# Patient Record
Sex: Female | Born: 1990 | Race: White | Hispanic: No | Marital: Single | Smoking: Former smoker
Health system: Southern US, Community
[De-identification: ages and names within clinical notes are randomized; demographics above are authoritative.]

## PROBLEM LIST (undated history)

## (undated) ENCOUNTER — Inpatient Hospital Stay (HOSPITAL_COMMUNITY): Payer: BC Managed Care – PPO

## (undated) ENCOUNTER — Inpatient Hospital Stay (HOSPITAL_COMMUNITY): Payer: Self-pay

## (undated) DIAGNOSIS — D6851 Activated protein C resistance: Secondary | ICD-10-CM

## (undated) DIAGNOSIS — N390 Urinary tract infection, site not specified: Secondary | ICD-10-CM

## (undated) DIAGNOSIS — N200 Calculus of kidney: Secondary | ICD-10-CM

## (undated) DIAGNOSIS — K219 Gastro-esophageal reflux disease without esophagitis: Secondary | ICD-10-CM

## (undated) DIAGNOSIS — R51 Headache: Secondary | ICD-10-CM

## (undated) DIAGNOSIS — Z8619 Personal history of other infectious and parasitic diseases: Secondary | ICD-10-CM

## (undated) HISTORY — DX: Gastro-esophageal reflux disease without esophagitis: K21.9

## (undated) HISTORY — PX: DILATION AND CURETTAGE OF UTERUS: SHX78

## (undated) HISTORY — DX: Personal history of other infectious and parasitic diseases: Z86.19

---
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
## 2008-04-20 ENCOUNTER — Inpatient Hospital Stay (HOSPITAL_COMMUNITY): Admission: AD | Admit: 2008-04-20 | Discharge: 2008-04-20 | Payer: Self-pay | Admitting: Obstetrics & Gynecology

## 2009-04-27 ENCOUNTER — Emergency Department (HOSPITAL_COMMUNITY): Admission: EM | Admit: 2009-04-27 | Discharge: 2009-04-27 | Payer: Self-pay | Admitting: Emergency Medicine

## 2010-06-23 ENCOUNTER — Inpatient Hospital Stay (INDEPENDENT_AMBULATORY_CARE_PROVIDER_SITE_OTHER)
Admission: RE | Admit: 2010-06-23 | Discharge: 2010-06-23 | Disposition: A | Discharge status: Home / Self Care | Payer: Self-pay | Source: Ambulatory Visit | Attending: Family Medicine | Admitting: Family Medicine

## 2010-06-23 DIAGNOSIS — R112 Nausea with vomiting, unspecified: Secondary | ICD-10-CM

## 2010-06-23 LAB — POCT URINALYSIS DIPSTICK
Bilirubin Urine: NEGATIVE
Ketones, ur: NEGATIVE mg/dL
Nitrite: NEGATIVE
Protein, ur: NEGATIVE mg/dL
Specific Gravity, Urine: 1.015 (ref 1.005–1.030)
Urine Glucose, Fasting: NEGATIVE mg/dL
Urobilinogen, UA: 0.2 mg/dL (ref 0.0–1.0)
pH: 8.5 — ABNORMAL HIGH (ref 5.0–8.0)

## 2010-06-23 LAB — POCT PREGNANCY, URINE: Preg Test, Ur: NEGATIVE

## 2010-06-23 LAB — TSH: TSH: 2.25 u[IU]/mL (ref 0.350–4.500)

## 2010-08-19 LAB — POCT PREGNANCY, URINE: Preg Test, Ur: NEGATIVE

## 2010-08-19 LAB — POCT URINALYSIS DIP (DEVICE)
Bilirubin Urine: NEGATIVE
Glucose, UA: NEGATIVE mg/dL
Hgb urine dipstick: NEGATIVE
Ketones, ur: NEGATIVE mg/dL
Nitrite: NEGATIVE
Protein, ur: NEGATIVE mg/dL
Specific Gravity, Urine: 1.025 (ref 1.005–1.030)
Urobilinogen, UA: 0.2 mg/dL (ref 0.0–1.0)
pH: 6 (ref 5.0–8.0)

## 2011-02-20 LAB — URINALYSIS, ROUTINE W REFLEX MICROSCOPIC
Glucose, UA: NEGATIVE mg/dL
Ketones, ur: NEGATIVE mg/dL
Nitrite: NEGATIVE
Specific Gravity, Urine: 1.02 (ref 1.005–1.030)
pH: 6 (ref 5.0–8.0)

## 2011-02-20 LAB — POCT PREGNANCY, URINE: Preg Test, Ur: POSITIVE

## 2011-10-18 ENCOUNTER — Emergency Department (HOSPITAL_COMMUNITY): Payer: Worker's Compensation

## 2011-10-18 ENCOUNTER — Encounter (HOSPITAL_COMMUNITY): Payer: Self-pay | Admitting: Emergency Medicine

## 2011-10-18 ENCOUNTER — Emergency Department (HOSPITAL_COMMUNITY)
Admission: EM | Admit: 2011-10-18 | Discharge: 2011-10-19 | Disposition: A | Discharge status: Home / Self Care | Payer: Worker's Compensation | Attending: Emergency Medicine | Admitting: Emergency Medicine

## 2011-10-18 DIAGNOSIS — M545 Low back pain, unspecified: Secondary | ICD-10-CM | POA: Insufficient documentation

## 2011-10-18 DIAGNOSIS — M546 Pain in thoracic spine: Secondary | ICD-10-CM | POA: Insufficient documentation

## 2011-10-18 DIAGNOSIS — F17200 Nicotine dependence, unspecified, uncomplicated: Secondary | ICD-10-CM | POA: Insufficient documentation

## 2011-10-18 DIAGNOSIS — M549 Dorsalgia, unspecified: Secondary | ICD-10-CM

## 2011-10-18 NOTE — ED Notes (Signed)
Pt alert, nad, c/o low back and right lower back pain, onset last week after work related injury, pt ambulates to triage,. Steady gait noted

## 2011-10-18 NOTE — ED Provider Notes (Signed)
History     CSN: [id number]  Arrival date & time 10/18/11  2153   First MD Initiated Contact with Patient 10/18/11 2306      Chief Complaint  Patient presents with  . Back Pain    (Consider location/radiation/quality/duration/timing/severity/associated sxs/prior treatment) HPI Comments: Patient is a current everyday smoker who presents emergency department with a chief complaint of back pain.  Onset of symptoms began last Sunday after lifting 20 pound charcoal bag over her head while at work [employer]).  Patient's pain has been gradually worsening that event.  Location is the lumbar region and thoracic region right side greater than left side and described as a dull constant aching that does not radiate.  Patient denies any numbness or tingling of extremities, bowel or bladder incontinence, saddle paresthesias, fevers, night sweats, chills, IV drug use, cough.  Pain is worsened by standing for long periods and bending.  Patient has been taken 800 IV Zofran 4 times a day without relief.  Patient states she wakes up in the morning and pain is mild and gradually worsened throughout the day.  Patient has no other complaints at this time.  Patient is a 21 y.o. female presenting with back pain. The history is provided by the patient.  Back Pain  Pertinent negatives include no chest pain, no fever, no numbness, no headaches, no abdominal pain, no dysuria and no weakness.    History reviewed. No pertinent past medical history.  History reviewed. No pertinent past surgical history.  No family history on file.  History  Substance Use Topics  . Smoking status: Current Everyday Smoker -- 1.0 packs/day    Types: Cigarettes  . Smokeless tobacco: Not on file  . Alcohol Use: No    OB History    Grav Para Term Preterm Abortions TAB SAB Ect Mult Living                  Review of Systems  Constitutional: Negative for fever, chills and appetite change.  HENT: Negative for congestion.   Eyes:  Negative for visual disturbance.  Respiratory: Negative for shortness of breath.   Cardiovascular: Negative for chest pain and leg swelling.  Gastrointestinal: Negative for abdominal pain.  Genitourinary: Negative for dysuria, urgency and frequency.  Musculoskeletal: Positive for back pain.  Neurological: Negative for dizziness, syncope, weakness, light-headedness, numbness and headaches.  Psychiatric/Behavioral: Negative for confusion.    Allergies  Review of patient's allergies indicates no known allergies.  Home Medications   Current Outpatient Rx  Name Route Sig Dispense Refill  . NAPROXEN SODIUM 220 MG PO TABS Oral Take 220 mg by mouth as needed. Pain      BP 124/78  Pulse 106  Temp(Src) 98.9 F (37.2 C) (Oral)  Resp 20  Ht 5\' 4"  (1.626 m)  Wt 159 lb 6.4 oz (72.303 kg)  BMI 27.36 kg/m2  SpO2 98%  LMP 10/11/2011  Physical Exam  Nursing note and vitals reviewed. Constitutional: She is oriented to person, place, and time. She appears well-developed and well-nourished. No distress.  HENT:  Head: Normocephalic and atraumatic.  Eyes: Conjunctivae and EOM are normal. Pupils are equal, round, and reactive to light. No scleral icterus.  Neck: Normal range of motion and full passive range of motion without pain. Neck supple. No spinous process tenderness and no muscular tenderness present. No rigidity. Normal range of motion present. No Brudzinski's sign noted.  Cardiovascular: Normal rate, regular rhythm and intact distal pulses.  Exam reveals no gallop and no  friction rub.   No murmur heard. Pulmonary/Chest: Effort normal and breath sounds normal. No respiratory distress. She has no wheezes. She has no rales. She exhibits no tenderness.  Musculoskeletal:       Cervical back: She exhibits normal range of motion, no tenderness, no bony tenderness and no pain.       Thoracic back: She exhibits tenderness, bony tenderness and pain.       Lumbar back: She exhibits tenderness, bony  tenderness and pain. She exhibits no spasm and normal pulse.       Back:       Right foot: She exhibits no swelling.       Left foot: She exhibits no swelling.       Bilateral lower extremities nontender without color change, baseline range of motion of extremities with intact distal pulses, capillary refill less than 2 seconds bilaterally.  Pt has increased pain w ROM of lumbar spine. Pain w ambulation, no sign of ataxia.  Neurological: She is alert and oriented to person, place, and time. She has normal strength and normal reflexes. No sensory deficit. Gait normal.       sensation at baseline for light touch in all 4 distal extremities, motor symmetric & bilateral 5/5  Abduction,adduction,flexion of hips, knee flexion & extension, foot dorsiflexion, foot plantar flexion.  Skin: Skin is warm and dry. No rash noted. She is not diaphoretic. No erythema. No pallor.  Psychiatric: She has a normal mood and affect.    ED Course  Procedures (including critical care time)   Labs Reviewed  URINALYSIS, ROUTINE W REFLEX MICROSCOPIC   Dg Thoracic Spine 2 View  10/19/2011  *RADIOLOGY REPORT*  Clinical Data: Injured back.  THORACIC SPINE - 2 VIEW  Comparison: None  Findings: The lateral film demonstrates normal alignment of the thoracic vertebral bodies.  Disc spaces and vertebral bodies are maintained.  No acute bony findings, destructive bony changes or abnormal paraspinal soft tissue swelling.  The visualized posterior ribs appear normal.  IMPRESSION: Normal alignment and no acute bony findings.  Original Report Authenticated By: P. Loralie Champagne, M.D.   Dg Lumbar Spine Complete  10/19/2011  *RADIOLOGY REPORT*  Clinical Data: Injured back.  LUMBAR SPINE - COMPLETE 4+ VIEW  Comparison: None  Findings: The lateral film demonstrates normal alignment. Vertebral bodies and disc spaces are maintained.  No acute bony findings.  Normal alignment of the facet joints and no pars defects.  The visualized bony  pelvis in intact.  IMPRESSION: Normal alignment and no acute bony findings.  Original Report Authenticated By: P. Loralie Champagne, M.D.     No diagnosis found.    MDM  Back pain  Patient with back pain.  No neurological deficits and normal neuro exam.  Patient can walk but states is painful.  No loss of bowel or bladder control.  No concern for cauda equina.  No fever, night sweats, weight loss, h/o cancer, IVDU.  RICE protocol and pain medicine indicated and discussed with patient.          Jaci Carrel, [place] 10/20/11 1612

## 2011-10-18 NOTE — ED Notes (Signed)
Patient transported to X-ray 

## 2011-10-19 LAB — URINALYSIS, ROUTINE W REFLEX MICROSCOPIC
Bilirubin Urine: NEGATIVE
Leukocytes, UA: NEGATIVE
Nitrite: NEGATIVE
Specific Gravity, Urine: 1.019 (ref 1.005–1.030)
Urobilinogen, UA: 0.2 mg/dL (ref 0.0–1.0)

## 2011-10-19 MED ORDER — HYDROCODONE-ACETAMINOPHEN 5-325 MG PO TABS
1.0000 | ORAL_TABLET | Freq: Once | ORAL | Status: AC
  Administered 2011-10-19: 1 via ORAL
  Filled 2011-10-19: qty 1

## 2011-10-19 MED ORDER — HYDROCODONE-ACETAMINOPHEN 5-325 MG PO TABS: 1.0000 | ORAL_TABLET | Freq: Four times a day (QID) | ORAL | Status: AC | PRN

## 2011-10-19 MED ORDER — DIAZEPAM 5 MG PO TABS
5.0000 mg | ORAL_TABLET | Freq: Once | ORAL | Status: AC
  Administered 2011-10-19: 5 mg via ORAL
  Filled 2011-10-19: qty 1

## 2011-10-19 MED ORDER — KETOROLAC TROMETHAMINE 60 MG/2ML IM SOLN
60.0000 mg | Freq: Once | INTRAMUSCULAR | Status: AC
  Administered 2011-10-19: 60 mg via INTRAMUSCULAR
  Filled 2011-10-19: qty 2

## 2011-10-19 MED ORDER — DIAZEPAM 10 MG RE GEL: 5.0000 mg | Freq: Once | RECTAL | Status: DC

## 2011-10-19 MED ORDER — DIAZEPAM 5 MG PO TABS: 5.0000 mg | ORAL_TABLET | Freq: Three times a day (TID) | ORAL | Status: AC | PRN

## 2011-10-19 NOTE — Discharge Instructions (Signed)
Back Exercises Back exercises help treat and prevent back injuries. The goal of back exercises is to increase the strength of your abdominal and back muscles and the flexibility of your back. These exercises should be started when you no longer have back pain. Back exercises include:  Pelvic Tilt. Lie on your back with your knees bent. Tilt your pelvis until the lower part of your back is against the floor. Hold this position 5 to 10 sec and repeat 5 to 10 times.   Knee to Chest. Pull first 1 knee up against your chest and hold for 20 to 30 seconds, repeat this with the other knee, and then both knees. This may be done with the other leg straight or bent, whichever feels better.   Sit-Ups or Curl-Ups. Bend your knees 90 degrees. Start with tilting your pelvis, and do a partial, slow sit-up, lifting your trunk only 30 to 45 degrees off the floor. Take at least 2 to 3 seconds for each sit-up. Do not do sit-ups with your knees out straight. If partial sit-ups are difficult, simply do the above but with only tightening your abdominal muscles and holding it as directed.   Hip-Lift. Lie on your back with your knees flexed 90 degrees. Push down with your feet and shoulders as you raise your hips a couple inches off the floor; hold for 10 seconds, repeat 5 to 10 times.   Back arches. Lie on your stomach, propping yourself up on bent elbows. Slowly press on your hands, causing an arch in your low back. Repeat 3 to 5 times. Any initial stiffness and discomfort should lessen with repetition over time.   Shoulder-Lifts. Lie face down with arms beside your body. Keep hips and torso pressed to floor as you slowly lift your head and shoulders off the floor.  Do not overdo your exercises, especially in the beginning. Exercises may cause you some mild back discomfort which lasts for a few minutes; however, if the pain is more severe, or lasts for more than 15 minutes, do not continue exercises until you see your  caregiver. Improvement with exercise therapy for back problems is slow.  See your caregivers for assistance with developing a proper back exercise program. Document Released: 06/11/2004 Document Revised: 04/23/2011 Document Reviewed: 05/04/2005 [employer] Patient Information 2012 [employer]. 

## 2011-10-19 NOTE — ED Notes (Signed)
Pt ambulates to discharge.

## 2011-10-19 NOTE — ED Notes (Signed)
Pt alert nad.

## 2011-10-20 NOTE — ED Provider Notes (Signed)
Medical screening examination/treatment/procedure(s) were performed by non-physician practitioner and as supervising physician I was immediately available for consultation/collaboration.  Geoffery Lyons, MD 10/20/11 [phone]

## 2012-04-12 ENCOUNTER — Ambulatory Visit (HOSPITAL_COMMUNITY): Payer: BC Managed Care – PPO | Attending: Obstetrics and Gynecology

## 2012-04-13 ENCOUNTER — Encounter: Payer: Self-pay | Admitting: Obstetrics and Gynecology

## 2012-05-06 ENCOUNTER — Ambulatory Visit (HOSPITAL_COMMUNITY): Payer: BC Managed Care – PPO | Attending: Obstetrics and Gynecology

## 2012-07-30 ENCOUNTER — Inpatient Hospital Stay (HOSPITAL_COMMUNITY)
Admission: AD | Admit: 2012-07-30 | Discharge: 2012-07-30 | Disposition: A | Discharge status: Home / Self Care | Payer: BC Managed Care – PPO | Source: Ambulatory Visit | Attending: Obstetrics and Gynecology | Admitting: Obstetrics and Gynecology

## 2012-07-30 ENCOUNTER — Encounter (HOSPITAL_COMMUNITY): Payer: Self-pay | Admitting: *Deleted

## 2012-07-30 DIAGNOSIS — O210 Mild hyperemesis gravidarum: Secondary | ICD-10-CM | POA: Insufficient documentation

## 2012-07-30 HISTORY — DX: Activated protein C resistance: D68.51

## 2012-07-30 LAB — URINALYSIS, ROUTINE W REFLEX MICROSCOPIC
Glucose, UA: NEGATIVE mg/dL
Ketones, ur: NEGATIVE mg/dL
Leukocytes, UA: NEGATIVE
Protein, ur: NEGATIVE mg/dL
Urobilinogen, UA: 0.2 mg/dL (ref 0.0–1.0)

## 2012-07-30 LAB — URINE MICROSCOPIC-ADD ON

## 2012-07-30 MED ORDER — ONDANSETRON 8 MG PO TBDP: 8.0000 mg | ORAL_TABLET | Freq: Three times a day (TID) | ORAL | Status: DC | PRN

## 2012-07-30 MED ORDER — ONDANSETRON 8 MG PO TBDP
8.0000 mg | ORAL_TABLET | Freq: Once | ORAL | Status: AC
  Administered 2012-07-30: 8 mg via ORAL
  Filled 2012-07-30: qty 1

## 2012-07-30 MED ORDER — DOCUSATE SODIUM 100 MG PO CAPS: 100.0000 mg | ORAL_CAPSULE | Freq: Two times a day (BID) | ORAL | Status: DC

## 2012-07-30 NOTE — MAU Provider Note (Signed)
History     CSN: [id number]  Arrival date and time: 07/30/12 1422   First Provider Initiated Contact with Patient 07/30/12 1507      Chief Complaint  Patient presents with  . Morning Sickness  . Emesis During Pregnancy   HPI 22 y.o. G3P0011 at [redacted] weeks EGA with n/v x 2 days, no vomiting today, but nauseous. Has appt with Dr. Claiborne Billings on 3/31.    Past Medical History  Diagnosis Date  . Factor 5 Leiden mutation, heterozygous     Past Surgical History  Procedure Laterality Date  . Dilation and curettage of uterus      Family History  Problem Relation Age of Onset  . Stroke Mother     History  Substance Use Topics  . Smoking status: Former Smoker -- 1.00 packs/day    Types: Cigarettes    Quit date: 07/12/2012  . Smokeless tobacco: Not on file  . Alcohol Use: No    Allergies:  Allergies  Allergen Reactions  . 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 aspirin 81 MG tablet Take 81 mg by mouth daily.      . Prenatal Vit-Fe Fumarate-FA (PRENATAL MULTIVITAMIN) TABS Take 1 tablet by mouth daily at 12 noon.        Review of Systems  Constitutional: Negative.   Respiratory: Negative.   Cardiovascular: Negative.   Gastrointestinal: Positive for nausea and vomiting. Negative for abdominal pain, diarrhea and constipation.  Genitourinary: Negative for dysuria, urgency, frequency, hematuria and flank pain.       Negative for vaginal bleeding, vaginal discharge, dyspareunia  Musculoskeletal: Negative.   Neurological: Negative.   Psychiatric/Behavioral: Negative.    Physical Exam   Blood pressure 123/74, pulse 80, temperature 98.4 F (36.9 C), temperature source Oral, resp. rate 18, last menstrual period 06/14/2012.  Physical Exam  Nursing note and vitals reviewed. Constitutional: She is oriented to person, place, and time. She appears well-developed and well-nourished. No distress.   Cardiovascular: Normal rate.   Respiratory: Effort normal.  Musculoskeletal: Normal range of motion.  Neurological: She is alert and oriented to person, place, and time.  Skin: Skin is warm and dry.  Psychiatric: She has a normal mood and affect.    MAU Course  Procedures Results for orders placed during the hospital encounter of 07/30/12 (from the past 24 hour(s))  URINALYSIS, ROUTINE W REFLEX MICROSCOPIC     Status: Abnormal   Collection Time    07/30/12  2:30 PM      Result Value Range   Color, Urine YELLOW  YELLOW   APPearance CLEAR  CLEAR   Specific Gravity, Urine 1.010  1.005 - 1.030   pH 6.0  5.0 - 8.0   Glucose, UA NEGATIVE  NEGATIVE mg/dL   Hgb urine dipstick TRACE (*) NEGATIVE   Bilirubin Urine NEGATIVE  NEGATIVE   Ketones, ur NEGATIVE  NEGATIVE mg/dL   Protein, ur NEGATIVE  NEGATIVE mg/dL   Urobilinogen, UA 0.2  0.0 - 1.0 mg/dL   Nitrite NEGATIVE  NEGATIVE   Leukocytes, UA NEGATIVE  NEGATIVE  URINE MICROSCOPIC-ADD ON     Status: Abnormal   Collection Time    07/30/12  2:30 PM      Result Value Range   Squamous Epithelial / LPF MANY (*) RARE   RBC / HPF 0-2  <3 RBC/hpf   Urine-Other MUCOUS PRESENT    POCT PREGNANCY, URINE  Status: Abnormal   Collection Time    07/30/12  2:41 PM      Result Value Range   Preg Test, Ur POSITIVE (*) NEGATIVE   Zofran ODT 8 mg given in MAU  Assessment and Plan  21 y.o. G3P0020 at 6.[redacted] weeks EGA Rx Zofran ODT 8 mg po tid prn n/v, also sent rx for Colace 100 mg BID d/t constipating effect of Zofran Rev'd dietary measures for n/v in pregnancy F/U as scheduled  Wacey Zieger 07/30/2012, 3:07 PM

## 2012-07-30 NOTE — MAU Note (Signed)
Pt reports she has N/V for the [past 3-4 days. Feels very weak like she could not get out of bed for several days.

## 2012-08-11 ENCOUNTER — Other Ambulatory Visit (HOSPITAL_COMMUNITY): Payer: Self-pay | Admitting: Advanced Practice Midwife

## 2012-08-15 LAB — US OB COMP LESS 14 WKS

## 2012-08-30 ENCOUNTER — Ambulatory Visit (HOSPITAL_COMMUNITY): Payer: BC Managed Care – PPO

## 2012-09-02 ENCOUNTER — Ambulatory Visit (HOSPITAL_COMMUNITY)
Admission: RE | Admit: 2012-09-02 | Discharge: 2012-09-02 | Disposition: A | Discharge status: Home / Self Care | Payer: BC Managed Care – PPO | Source: Ambulatory Visit | Attending: Obstetrics and Gynecology | Admitting: Obstetrics and Gynecology

## 2012-09-02 ENCOUNTER — Encounter (HOSPITAL_COMMUNITY): Payer: Self-pay

## 2012-09-05 LAB — OB RESULTS CONSOLE RPR: RPR: NONREACTIVE

## 2012-09-05 LAB — OB RESULTS CONSOLE ABO/RH

## 2012-09-05 LAB — OB RESULTS CONSOLE HIV ANTIBODY (ROUTINE TESTING): HIV: NONREACTIVE

## 2012-10-05 ENCOUNTER — Encounter (HOSPITAL_COMMUNITY): Payer: Self-pay | Admitting: *Deleted

## 2012-10-05 ENCOUNTER — Inpatient Hospital Stay (HOSPITAL_COMMUNITY)
Admission: AD | Admit: 2012-10-05 | Discharge: 2012-10-05 | Disposition: A | Discharge status: Home / Self Care | Payer: BC Managed Care – PPO | Source: Ambulatory Visit | Attending: Obstetrics and Gynecology | Admitting: Obstetrics and Gynecology

## 2012-10-05 DIAGNOSIS — O210 Mild hyperemesis gravidarum: Secondary | ICD-10-CM

## 2012-10-05 DIAGNOSIS — R748 Abnormal levels of other serum enzymes: Secondary | ICD-10-CM

## 2012-10-05 DIAGNOSIS — K529 Noninfective gastroenteritis and colitis, unspecified: Secondary | ICD-10-CM

## 2012-10-05 DIAGNOSIS — K5289 Other specified noninfective gastroenteritis and colitis: Secondary | ICD-10-CM | POA: Insufficient documentation

## 2012-10-05 DIAGNOSIS — O99891 Other specified diseases and conditions complicating pregnancy: Secondary | ICD-10-CM | POA: Insufficient documentation

## 2012-10-05 DIAGNOSIS — R197 Diarrhea, unspecified: Secondary | ICD-10-CM | POA: Insufficient documentation

## 2012-10-05 DIAGNOSIS — O219 Vomiting of pregnancy, unspecified: Secondary | ICD-10-CM

## 2012-10-05 HISTORY — DX: Calculus of kidney: N20.0

## 2012-10-05 HISTORY — DX: Urinary tract infection, site not specified: N39.0

## 2012-10-05 HISTORY — DX: Headache: R51

## 2012-10-05 LAB — COMPREHENSIVE METABOLIC PANEL
ALT: 58 U/L — ABNORMAL HIGH (ref 0–35)
Alkaline Phosphatase: 51 U/L (ref 39–117)
CO2: 24 mEq/L (ref 19–32)
Chloride: 101 mEq/L (ref 96–112)
GFR calc Af Amer: >90 mL/min (ref >90)
GFR calc non Af Amer: >90 mL/min (ref >90)
Glucose, Bld: 81 mg/dL (ref 70–99)
Potassium: 3.5 mEq/L (ref 3.5–5.1)
Sodium: 136 mEq/L (ref 135–145)
Total Bilirubin: 0.2 mg/dL — ABNORMAL LOW (ref 0.3–1.2)

## 2012-10-05 LAB — URINALYSIS, ROUTINE W REFLEX MICROSCOPIC
Leukocytes, UA: NEGATIVE
Nitrite: NEGATIVE
Specific Gravity, Urine: 1.025 (ref 1.005–1.030)
pH: 6 (ref 5.0–8.0)

## 2012-10-05 LAB — CBC
Hemoglobin: 11.4 g/dL — ABNORMAL LOW (ref 12.0–15.0)
RBC: 3.79 MIL/uL — ABNORMAL LOW (ref 3.87–5.11)

## 2012-10-05 LAB — URINE MICROSCOPIC-ADD ON

## 2012-10-05 LAB — AMYLASE: Amylase: 43 U/L (ref 0–105)

## 2012-10-05 MED ORDER — ONDANSETRON 8 MG PO TBDP: 8.0000 mg | ORAL_TABLET | Freq: Three times a day (TID) | ORAL | Status: DC | PRN

## 2012-10-05 MED ORDER — PROMETHAZINE HCL 25 MG PO TABS: 25.0000 mg | ORAL_TABLET | Freq: Four times a day (QID) | ORAL | Status: DC | PRN

## 2012-10-05 MED ORDER — DEXTROSE 5 % IN LACTATED RINGERS IV BOLUS
1000.0000 mL | Freq: Once | INTRAVENOUS | Status: AC
  Administered 2012-10-05: 1000 mL via INTRAVENOUS

## 2012-10-05 MED ORDER — ONDANSETRON HCL 4 MG/2ML IJ SOLN
4.0000 mg | Freq: Once | INTRAMUSCULAR | Status: AC
  Administered 2012-10-05: 4 mg via INTRAVENOUS
  Filled 2012-10-05: qty 2

## 2012-10-05 NOTE — MAU Note (Signed)
States has been vomiting off and on since pregnant. Worse again past 3 days. Now has diarrhea. Not sure if she has had fever. 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

## 2012-10-05 NOTE — MAU Provider Note (Signed)
History     CSN: [id number]  Arrival date and time: 10/05/12 1135   None     Chief Complaint  Patient presents with  . Emesis  . Diarrhea   HPI 22 y.o. G3P0020 at [redacted]w[redacted]d with n/v/d x 3 days, no fever. Took a Zofran on Sunday with minimal relief.   Past Medical History  Diagnosis Date  . Factor 5 Leiden mutation, heterozygous   . Kidney stones   . Urinary tract infection   . Headache     migraines    Past Surgical History  Procedure Laterality Date  . Dilation and curettage of uterus      Family History  Problem Relation Age of Onset  . Stroke Mother     History  Substance Use Topics  . Smoking status: Former Smoker -- 1.00 packs/day    Types: Cigarettes    Quit date: 07/12/2012  . Smokeless tobacco: Not on file  . Alcohol Use: No    Allergies:  Allergies  Allergen Reactions  . 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 Penicillins Other (See Comments)    Pt states "causes gums to become sore and inflamed"    Prescriptions prior to admission  Medication Sig Dispense Refill  . aspirin 81 MG tablet Take 81 mg by mouth every morning.       . docusate sodium (COLACE) 100 MG capsule Take 1 capsule (100 mg total) by mouth 2 (two) times daily.  30 capsule  2  . ondansetron (ZOFRAN-ODT) 8 MG disintegrating tablet DISSOLVE 1 TABLET IN MOUTH EVERY 8 HOURS AS NEEDED FOR NAUSEA.  20 tablet  0  . Prenatal Vit-Fe Fumarate-FA (PRENATAL MULTIVITAMIN) TABS Take 1 tablet by mouth daily at 12 noon.        Review of Systems  Constitutional: Negative.  Negative for fever.  Respiratory: Negative.   Cardiovascular: Negative.   Gastrointestinal: Positive for nausea, vomiting and diarrhea. Negative for abdominal pain and constipation.  Genitourinary: Negative for dysuria, urgency, frequency, hematuria and flank pain.       Negative for vaginal bleeding, cramping/contractions  Musculoskeletal: Negative.   Neurological: Negative.   Psychiatric/Behavioral: Negative.     Physical Exam   Blood pressure 105/64, pulse 75, resp. rate 18, height 5\' 4"  (1.626 m), weight 169 lb (76.658 kg), last menstrual period 06/14/2012.  Physical Exam  Nursing note and vitals reviewed. Constitutional: She is oriented to person, place, and time. She appears well-developed and well-nourished. No distress.  Cardiovascular: Normal rate.   Respiratory: Effort normal.  GI: Soft. There is no tenderness.  Musculoskeletal: Normal range of motion.  Neurological: She is alert and oriented to person, place, and time.  Skin: Skin is warm and dry.  Psychiatric: She has a normal mood and affect.    MAU Course  Procedures  Results for orders placed during the hospital encounter of 10/05/12 (from the past 24 hour(s))  URINALYSIS, ROUTINE W REFLEX MICROSCOPIC     Status: Abnormal   Collection Time    10/05/12 11:50 AM      Result Value Range   Color, Urine YELLOW  YELLOW   APPearance HAZY (*) CLEAR   Specific Gravity, Urine 1.025  1.005 - 1.030   pH 6.0  5.0 - 8.0   Glucose, UA NEGATIVE  NEGATIVE mg/dL   Hgb urine dipstick NEGATIVE  NEGATIVE   Bilirubin Urine SMALL (*) NEGATIVE   Ketones, ur >80 (*) NEGATIVE mg/dL   Protein, ur 30 (*)  NEGATIVE mg/dL   Urobilinogen, UA 1.0  0.0 - 1.0 mg/dL   Nitrite NEGATIVE  NEGATIVE   Leukocytes, UA NEGATIVE  NEGATIVE  URINE MICROSCOPIC-ADD ON     Status: Abnormal   Collection Time    10/05/12 11:50 AM      Result Value Range   Squamous Epithelial / LPF MANY (*) RARE   WBC, UA 0-2  <3 WBC/hpf   Bacteria, UA FEW (*) RARE   Urine-Other MUCOUS PRESENT    CBC     Status: Abnormal   Collection Time    10/05/12 11:57 AM      Result Value Range   WBC 7.5  4.0 - 10.5 K/uL   RBC 3.79 (*) 3.87 - 5.11 MIL/uL   Hemoglobin 11.4 (*) 12.0 - 15.0 g/dL   HCT 16.1 (*) 09.6 - 04.5 %   MCV 87.1  78.0 - 100.0 fL   MCH 30.1  26.0 - 34.0 pg   MCHC 34.5  30.0 - 36.0 g/dL   RDW 40.9  81.1 - 91.4 %   Platelets 220  150 - 400 K/uL  COMPREHENSIVE  METABOLIC PANEL     Status: Abnormal   Collection Time    10/05/12 11:57 AM      Result Value Range   Sodium 136  135 - 145 mEq/L   Potassium 3.5  3.5 - 5.1 mEq/L   Chloride 101  96 - 112 mEq/L   CO2 24  19 - 32 mEq/L   Glucose, Bld 81  70 - 99 mg/dL   BUN 7  6 - 23 mg/dL   Creatinine, Ser 7.82  0.50 - 1.10 mg/dL   Calcium 8.7  8.4 - 95.6 mg/dL   Total Protein 6.4  6.0 - 8.3 g/dL   Albumin 3.4 (*) 3.5 - 5.2 g/dL   AST 58 (*) 0 - 37 U/L   ALT 58 (*) 0 - 35 U/L   Alkaline Phosphatase 51  39 - 117 U/L   Total Bilirubin 0.2 (*) 0.3 - 1.2 mg/dL   GFR calc non Af Amer >90  >90 mL/min   GFR calc Af Amer >90  >90 mL/min   Feeling better with IV hydration and Zofran 4 mg IV   Assessment and Plan   1. Nausea and vomiting in pregnancy prior to [redacted] weeks gestation   2. Acute gastroenteritis   3. Elevated liver enzymes   Zofran and Phenergan prescribed as below Elevated AST/ALT - f/u in office on Friday to repeat, amylase and lipase pending    Medication List    TAKE these medications       aspirin 81 MG tablet  Take 81 mg by mouth every morning.     docusate sodium 100 MG capsule  Commonly known as:  COLACE  Take 1 capsule (100 mg total) by mouth 2 (two) times daily.     ondansetron 8 MG disintegrating tablet  Commonly known as:  ZOFRAN ODT  Take 1 tablet (8 mg total) by mouth every 8 (eight) hours as needed for nausea.     prenatal multivitamin Tabs  Take 1 tablet by mouth daily at 12 noon.     promethazine 25 MG tablet  Commonly known as:  PHENERGAN  Take 1 tablet (25 mg total) by mouth every 6 (six) hours as needed for nausea.            Follow-up Information   Follow up with Levi Aland, MD On 10/07/2012. (repeat labs)  Contact information:   [address] [place] [address] [phone]         [facility] 10/05/2012, 12:28 PM

## 2013-03-14 ENCOUNTER — Encounter (HOSPITAL_COMMUNITY): Payer: Self-pay | Admitting: *Deleted

## 2013-03-14 ENCOUNTER — Telehealth (HOSPITAL_COMMUNITY): Payer: Self-pay | Admitting: *Deleted

## 2013-03-14 NOTE — Telephone Encounter (Signed)
Preadmission screen  

## 2013-03-21 ENCOUNTER — Encounter (HOSPITAL_COMMUNITY): Payer: Self-pay | Admitting: *Deleted

## 2013-03-21 ENCOUNTER — Inpatient Hospital Stay (HOSPITAL_COMMUNITY)
Admission: AD | Admit: 2013-03-21 | Discharge: 2013-03-25 | DRG: 765 | Disposition: A | Discharge status: Home / Self Care | Payer: Medicaid Other | Source: Ambulatory Visit | Attending: Obstetrics and Gynecology | Admitting: Obstetrics and Gynecology

## 2013-03-21 ENCOUNTER — Inpatient Hospital Stay (HOSPITAL_COMMUNITY): Admission: RE | Admit: 2013-03-21 | Payer: Medicaid Other | Source: Ambulatory Visit

## 2013-03-21 DIAGNOSIS — O324XX0 Maternal care for high head at term, not applicable or unspecified: Secondary | ICD-10-CM | POA: Diagnosis present

## 2013-03-21 DIAGNOSIS — O411090 Infection of amniotic sac and membranes, unspecified, unspecified trimester, not applicable or unspecified: Secondary | ICD-10-CM | POA: Diagnosis present

## 2013-03-21 DIAGNOSIS — D689 Coagulation defect, unspecified: Secondary | ICD-10-CM | POA: Diagnosis present

## 2013-03-21 DIAGNOSIS — D6859 Other primary thrombophilia: Secondary | ICD-10-CM | POA: Diagnosis present

## 2013-03-21 LAB — CBC
HCT: 34.1 % — ABNORMAL LOW (ref 36.0–46.0)
Hemoglobin: 12.1 g/dL (ref 12.0–15.0)
MCHC: 35.5 g/dL (ref 30.0–36.0)
MCV: 85.3 fL (ref 78.0–100.0)
RDW: 13.1 % (ref 11.5–15.5)

## 2013-03-21 LAB — ABO/RH: ABO/RH(D): B POS

## 2013-03-21 MED ORDER — BUTORPHANOL TARTRATE 1 MG/ML IJ SOLN
1.0000 mg | INTRAMUSCULAR | Status: DC | PRN
  Administered 2013-03-22 (×2): 1 mg via INTRAVENOUS
  Filled 2013-03-21 (×2): qty 1

## 2013-03-21 MED ORDER — PHENYLEPHRINE 40 MCG/ML (10ML) SYRINGE FOR IV PUSH (FOR BLOOD PRESSURE SUPPORT)
80.0000 ug | PREFILLED_SYRINGE | INTRAVENOUS | Status: DC | PRN
  Filled 2013-03-21: qty 10

## 2013-03-21 MED ORDER — EPHEDRINE 5 MG/ML INJ: 10.0000 mg | INTRAVENOUS | Status: DC | PRN

## 2013-03-21 MED ORDER — OXYTOCIN BOLUS FROM INFUSION: 500.0000 mL | INTRAVENOUS | Status: DC

## 2013-03-21 MED ORDER — LIDOCAINE HCL (PF) 1 % IJ SOLN: 30.0000 mL | INTRAMUSCULAR | Status: DC | PRN

## 2013-03-21 MED ORDER — FENTANYL 2.5 MCG/ML BUPIVACAINE 1/10 % EPIDURAL INFUSION (WH - ANES)
14.0000 mL/h | INTRAMUSCULAR | Status: DC | PRN
  Administered 2013-03-22: 14 mL/h via EPIDURAL
  Filled 2013-03-21 (×2): qty 125

## 2013-03-21 MED ORDER — EPHEDRINE 5 MG/ML INJ
10.0000 mg | INTRAVENOUS | Status: DC | PRN
  Filled 2013-03-21: qty 4

## 2013-03-21 MED ORDER — DIPHENHYDRAMINE HCL 50 MG/ML IJ SOLN: 12.5000 mg | INTRAMUSCULAR | Status: DC | PRN

## 2013-03-21 MED ORDER — PHENYLEPHRINE 40 MCG/ML (10ML) SYRINGE FOR IV PUSH (FOR BLOOD PRESSURE SUPPORT): 80.0000 ug | PREFILLED_SYRINGE | INTRAVENOUS | Status: DC | PRN

## 2013-03-21 MED ORDER — LACTATED RINGERS IV SOLN: 500.0000 mL | INTRAVENOUS | Status: DC | PRN

## 2013-03-21 MED ORDER — LACTATED RINGERS IV SOLN: 500.0000 mL | Freq: Once | INTRAVENOUS | Status: DC

## 2013-03-21 MED ORDER — ACETAMINOPHEN 325 MG PO TABS
650.0000 mg | ORAL_TABLET | ORAL | Status: DC | PRN
  Administered 2013-03-22 (×2): 650 mg via ORAL
  Filled 2013-03-21 (×2): qty 2

## 2013-03-21 MED ORDER — LACTATED RINGERS IV SOLN
INTRAVENOUS | Status: DC
  Administered 2013-03-21 – 2013-03-22 (×9): via INTRAVENOUS

## 2013-03-21 MED ORDER — IBUPROFEN 600 MG PO TABS
600.0000 mg | ORAL_TABLET | Freq: Four times a day (QID) | ORAL | Status: DC | PRN
  Filled 2013-03-21: qty 1

## 2013-03-21 MED ORDER — OXYTOCIN 40 UNITS IN LACTATED RINGERS INFUSION - SIMPLE MED: 62.5000 mL/h | INTRAVENOUS | Status: DC

## 2013-03-21 MED ORDER — ONDANSETRON HCL 4 MG/2ML IJ SOLN: 4.0000 mg | Freq: Four times a day (QID) | INTRAMUSCULAR | Status: DC | PRN

## 2013-03-21 MED ORDER — TERBUTALINE SULFATE 1 MG/ML IJ SOLN: 0.2500 mg | Freq: Once | INTRAMUSCULAR | Status: AC | PRN

## 2013-03-21 MED ORDER — CITRIC ACID-SODIUM CITRATE 334-500 MG/5ML PO SOLN
30.0000 mL | ORAL | Status: DC | PRN
  Administered 2013-03-22: 30 mL via ORAL
  Filled 2013-03-21: qty 15

## 2013-03-21 MED ORDER — OXYCODONE-ACETAMINOPHEN 5-325 MG PO TABS: 1.0000 | ORAL_TABLET | ORAL | Status: DC | PRN

## 2013-03-21 MED ORDER — OXYTOCIN 40 UNITS IN LACTATED RINGERS INFUSION - SIMPLE MED
1.0000 m[IU]/min | INTRAVENOUS | Status: DC
  Administered 2013-03-21: 2 m[IU]/min via INTRAVENOUS
  Filled 2013-03-21: qty 1000

## 2013-03-21 NOTE — Progress Notes (Signed)
Pitocin decreased to infuse @ 43milliunits/min per Dr. Henderson Cloud via phone order d/t tachsystole.

## 2013-03-21 NOTE — MAU Note (Signed)
Dr. Tenny Craw notified of pt.  Pt to walk x 1 hr and recheck.

## 2013-03-21 NOTE — MAU Note (Signed)
Pt up to walk.

## 2013-03-21 NOTE — H&P (Signed)
22 y.o. [redacted]w[redacted]d  G4P0030 comes in c/o ctxes and ROM- clear at [phone].  Otherwise has good fetal movement and no bleeding.  Past Medical History  Diagnosis Date  . Factor 5 Leiden mutation, heterozygous   . Kidney stones   . Urinary tract infection   . Headache(784.0)     migraines  . GERD (gastroesophageal reflux disease)   . Hx of varicella     Past Surgical History  Procedure Laterality Date  . Dilation and curettage of uterus      OB History  Gravida Para Term Preterm AB SAB TAB Ectopic Multiple Living  4 0   3 2 1    0    # Outcome Date GA Lbr Len/2nd Weight Sex Delivery Anes PTL Lv  4 CUR           3 SAB 2013          2 TAB 2012          1 SAB 2009              History   Social History  . Marital Status: Single    Spouse Name: N/A    Number of Children: N/A  . Years of Education: N/A   Occupational History  . Not on file.   Social History Main Topics  . Smoking status: Former Smoker -- 1.00 packs/day    Types: Cigarettes    Quit date: 07/12/2012  . Smokeless tobacco: Never Used  . Alcohol Use: No  . Drug Use: No  . Sexual Activity: Not on file   Other Topics Concern  . Not on file   Social History Narrative  . No narrative on file   Other and Penicillins    Prenatal Transfer Tool  Maternal Diabetes: No Genetic Screening: Declined Maternal Ultrasounds/Referrals: Normal Fetal Ultrasounds or other Referrals:  None Maternal Substance Abuse:  No Significant Maternal Medications:  None Significant Maternal Lab Results: None  Other PNC: uncomplicated.    Filed Vitals:   03/21/13 0909  BP: 121/79  Pulse: 90  Temp: 98 F (36.7 C)  Resp:      Lungs/Cor:  NAD Abdomen:  soft, gravid Ex:  no cords, erythema SVE:  1/90/-2 per nurse, grossly ruptured FHTs:  130, good STV, NST R Toco:  q3-5   A/P   Term early labor and SROM.  GBS neg.  Tait Balistreri A

## 2013-03-21 NOTE — MAU Note (Signed)
Contractions every .

## 2013-03-22 ENCOUNTER — Encounter (HOSPITAL_COMMUNITY): Payer: Medicaid Other | Admitting: Anesthesiology

## 2013-03-22 ENCOUNTER — Inpatient Hospital Stay (HOSPITAL_COMMUNITY): Payer: Medicaid Other | Admitting: Anesthesiology

## 2013-03-22 ENCOUNTER — Encounter (HOSPITAL_COMMUNITY): Payer: Self-pay | Admitting: Anesthesiology

## 2013-03-22 ENCOUNTER — Encounter (HOSPITAL_COMMUNITY)
Admission: AD | Disposition: A | Discharge status: Home / Self Care | Payer: Self-pay | Source: Ambulatory Visit | Attending: Obstetrics and Gynecology

## 2013-03-22 LAB — RUBELLA SCREEN: Rubella: 3.11 Index — ABNORMAL HIGH (ref <0.90)

## 2013-03-22 SURGERY — Surgical Case
Anesthesia: Epidural | Site: Abdomen | Wound class: Clean Contaminated

## 2013-03-22 MED ORDER — OXYTOCIN 40 UNITS IN LACTATED RINGERS INFUSION - SIMPLE MED: 62.5000 mL/h | INTRAVENOUS | Status: AC

## 2013-03-22 MED ORDER — SCOPOLAMINE 1 MG/3DAYS TD PT72
MEDICATED_PATCH | TRANSDERMAL | Status: AC
  Filled 2013-03-22: qty 1

## 2013-03-22 MED ORDER — MEPERIDINE HCL 25 MG/ML IJ SOLN
INTRAMUSCULAR | Status: AC
  Filled 2013-03-22: qty 1

## 2013-03-22 MED ORDER — DIBUCAINE 1 % RE OINT: 1.0000 "application " | TOPICAL_OINTMENT | RECTAL | Status: DC | PRN

## 2013-03-22 MED ORDER — SODIUM BICARBONATE 8.4 % IV SOLN
INTRAVENOUS | Status: DC | PRN
  Administered 2013-03-22: 2 mL via EPIDURAL
  Administered 2013-03-22 (×2): 5 mL via EPIDURAL
  Administered 2013-03-22: 3 mL via EPIDURAL
  Administered 2013-03-22: 5 mL via EPIDURAL

## 2013-03-22 MED ORDER — LACTATED RINGERS IV SOLN
INTRAVENOUS | Status: DC
  Administered 2013-03-23 (×2): via INTRAVENOUS

## 2013-03-22 MED ORDER — KETOROLAC TROMETHAMINE 60 MG/2ML IM SOLN
INTRAMUSCULAR | Status: AC
  Administered 2013-03-22: 60 mg via INTRAMUSCULAR
  Filled 2013-03-22: qty 2

## 2013-03-22 MED ORDER — MEPERIDINE HCL 25 MG/ML IJ SOLN: 6.2500 mg | INTRAMUSCULAR | Status: DC | PRN

## 2013-03-22 MED ORDER — MORPHINE SULFATE (PF) 0.5 MG/ML IJ SOLN
INTRAMUSCULAR | Status: DC | PRN
  Administered 2013-03-22: 3 mg via EPIDURAL

## 2013-03-22 MED ORDER — KETOROLAC TROMETHAMINE 30 MG/ML IJ SOLN
30.0000 mg | Freq: Four times a day (QID) | INTRAMUSCULAR | Status: AC | PRN
  Administered 2013-03-23: 30 mg via INTRAVENOUS
  Filled 2013-03-22: qty 1

## 2013-03-22 MED ORDER — METHYLERGONOVINE MALEATE 0.2 MG/ML IJ SOLN
INTRAMUSCULAR | Status: AC
  Filled 2013-03-22: qty 1

## 2013-03-22 MED ORDER — FENTANYL CITRATE 0.05 MG/ML IJ SOLN
INTRAMUSCULAR | Status: DC | PRN
  Administered 2013-03-22 (×2): 50 ug via INTRAVENOUS

## 2013-03-22 MED ORDER — PHENYLEPHRINE HCL 10 MG/ML IJ SOLN
INTRAMUSCULAR | Status: DC | PRN
  Administered 2013-03-22: 80 ug via INTRAVENOUS

## 2013-03-22 MED ORDER — CEFAZOLIN SODIUM-DEXTROSE 2-3 GM-% IV SOLR
2.0000 g | Freq: Three times a day (TID) | INTRAVENOUS | Status: DC
  Administered 2013-03-22: 2 g via INTRAVENOUS
  Filled 2013-03-22 (×3): qty 50

## 2013-03-22 MED ORDER — KETOROLAC TROMETHAMINE 30 MG/ML IJ SOLN: 30.0000 mg | Freq: Four times a day (QID) | INTRAMUSCULAR | Status: AC | PRN

## 2013-03-22 MED ORDER — METHYLERGONOVINE MALEATE 0.2 MG/ML IJ SOLN
INTRAMUSCULAR | Status: DC | PRN
  Administered 2013-03-22: 0.2 mg via INTRAMUSCULAR

## 2013-03-22 MED ORDER — SIMETHICONE 80 MG PO CHEW
80.0000 mg | CHEWABLE_TABLET | Freq: Three times a day (TID) | ORAL | Status: DC
  Administered 2013-03-23 – 2013-03-25 (×6): 80 mg via ORAL
  Filled 2013-03-22 (×6): qty 1

## 2013-03-22 MED ORDER — WITCH HAZEL-GLYCERIN EX PADS: 1.0000 "application " | MEDICATED_PAD | CUTANEOUS | Status: DC | PRN

## 2013-03-22 MED ORDER — FENTANYL CITRATE 0.05 MG/ML IJ SOLN
25.0000 ug | INTRAMUSCULAR | Status: DC | PRN
  Administered 2013-03-22: 50 ug via INTRAVENOUS

## 2013-03-22 MED ORDER — KETOROLAC TROMETHAMINE 60 MG/2ML IM SOLN
60.0000 mg | Freq: Once | INTRAMUSCULAR | Status: AC | PRN
  Administered 2013-03-22: 60 mg via INTRAMUSCULAR

## 2013-03-22 MED ORDER — SIMETHICONE 80 MG PO CHEW
80.0000 mg | CHEWABLE_TABLET | ORAL | Status: DC
  Administered 2013-03-23 – 2013-03-24 (×2): 80 mg via ORAL
  Filled 2013-03-22 (×2): qty 1

## 2013-03-22 MED ORDER — LIDOCAINE HCL (PF) 1 % IJ SOLN
INTRAMUSCULAR | Status: DC | PRN
  Administered 2013-03-22 (×2): 4 mL

## 2013-03-22 MED ORDER — OXYCODONE-ACETAMINOPHEN 5-325 MG PO TABS
1.0000 | ORAL_TABLET | ORAL | Status: DC | PRN
  Administered 2013-03-23 (×3): 1 via ORAL
  Administered 2013-03-24 – 2013-03-25 (×5): 2 via ORAL
  Filled 2013-03-22: qty 2
  Filled 2013-03-22 (×2): qty 1
  Filled 2013-03-22 (×2): qty 2
  Filled 2013-03-22: qty 1
  Filled 2013-03-22 (×2): qty 2

## 2013-03-22 MED ORDER — FENTANYL CITRATE 0.05 MG/ML IJ SOLN
INTRAMUSCULAR | Status: AC
  Filled 2013-03-22: qty 2

## 2013-03-22 MED ORDER — ACETAMINOPHEN 160 MG/5ML PO SOLN
ORAL | Status: AC
Start: 2013-03-22 — End: 2013-03-23
  Filled 2013-03-22: qty 20.3

## 2013-03-22 MED ORDER — MEPERIDINE HCL 25 MG/ML IJ SOLN
INTRAMUSCULAR | Status: DC | PRN
  Administered 2013-03-22: 25 mg via INTRAVENOUS

## 2013-03-22 MED ORDER — FENTANYL 2.5 MCG/ML BUPIVACAINE 1/10 % EPIDURAL INFUSION (WH - ANES)
INTRAMUSCULAR | Status: DC | PRN
  Administered 2013-03-22: 14 mL/h via EPIDURAL

## 2013-03-22 MED ORDER — PRENATAL MULTIVITAMIN CH
1.0000 | ORAL_TABLET | Freq: Every day | ORAL | Status: DC
  Administered 2013-03-23 – 2013-03-24 (×2): 1 via ORAL
  Filled 2013-03-22 (×2): qty 1

## 2013-03-22 MED ORDER — LIDOCAINE-EPINEPHRINE (PF) 2 %-1:200000 IJ SOLN
INTRAMUSCULAR | Status: AC
  Filled 2013-03-22: qty 20

## 2013-03-22 MED ORDER — CLINDAMYCIN PHOSPHATE 900 MG/50ML IV SOLN: 900.0000 mg | Freq: Three times a day (TID) | INTRAVENOUS | Status: DC

## 2013-03-22 MED ORDER — OXYTOCIN 40 UNITS IN LACTATED RINGERS INFUSION - SIMPLE MED: 1.0000 m[IU]/min | INTRAVENOUS | Status: DC

## 2013-03-22 MED ORDER — SODIUM BICARBONATE 8.4 % IV SOLN
INTRAVENOUS | Status: AC
Start: 2013-03-22 — End: 2013-03-22
  Filled 2013-03-22: qty 50

## 2013-03-22 MED ORDER — ONDANSETRON HCL 4 MG PO TABS: 4.0000 mg | ORAL_TABLET | ORAL | Status: DC | PRN

## 2013-03-22 MED ORDER — MENTHOL 3 MG MT LOZG: 1.0000 | LOZENGE | OROMUCOSAL | Status: DC | PRN

## 2013-03-22 MED ORDER — OXYTOCIN 10 UNIT/ML IJ SOLN
40.0000 [IU] | INTRAVENOUS | Status: DC | PRN
  Administered 2013-03-22: 40 [IU] via INTRAVENOUS

## 2013-03-22 MED ORDER — DIPHENHYDRAMINE HCL 25 MG PO CAPS: 25.0000 mg | ORAL_CAPSULE | Freq: Four times a day (QID) | ORAL | Status: DC | PRN

## 2013-03-22 MED ORDER — ACETAMINOPHEN 160 MG/5ML PO SOLN
ORAL | Status: AC
  Administered 2013-03-22: 650 mg
  Filled 2013-03-22: qty 20.3

## 2013-03-22 MED ORDER — SIMETHICONE 80 MG PO CHEW: 80.0000 mg | CHEWABLE_TABLET | ORAL | Status: DC | PRN

## 2013-03-22 MED ORDER — ZOLPIDEM TARTRATE 5 MG PO TABS: 5.0000 mg | ORAL_TABLET | Freq: Every evening | ORAL | Status: DC | PRN

## 2013-03-22 MED ORDER — IBUPROFEN 600 MG PO TABS
600.0000 mg | ORAL_TABLET | Freq: Four times a day (QID) | ORAL | Status: DC
  Administered 2013-03-23 – 2013-03-25 (×8): 600 mg via ORAL
  Filled 2013-03-22 (×8): qty 1

## 2013-03-22 MED ORDER — ONDANSETRON HCL 4 MG/2ML IJ SOLN
INTRAMUSCULAR | Status: DC | PRN
  Administered 2013-03-22: 4 mg via INTRAVENOUS

## 2013-03-22 MED ORDER — SENNOSIDES-DOCUSATE SODIUM 8.6-50 MG PO TABS
2.0000 | ORAL_TABLET | ORAL | Status: DC
  Administered 2013-03-23 – 2013-03-24 (×2): 2 via ORAL
  Filled 2013-03-22 (×2): qty 2

## 2013-03-22 MED ORDER — GENTAMICIN SULFATE 40 MG/ML IJ SOLN
Freq: Three times a day (TID) | INTRAVENOUS | Status: DC
  Administered 2013-03-22 (×2): via INTRAVENOUS
  Filled 2013-03-22 (×4): qty 3.5

## 2013-03-22 MED ORDER — TETANUS-DIPHTH-ACELL PERTUSSIS 5-2.5-18.5 LF-MCG/0.5 IM SUSP: 0.5000 mL | Freq: Once | INTRAMUSCULAR | Status: DC

## 2013-03-22 MED ORDER — ONDANSETRON HCL 4 MG/2ML IJ SOLN: 4.0000 mg | INTRAMUSCULAR | Status: DC | PRN

## 2013-03-22 MED ORDER — ONDANSETRON HCL 4 MG/2ML IJ SOLN
INTRAMUSCULAR | Status: AC
  Filled 2013-03-22: qty 2

## 2013-03-22 MED ORDER — LANOLIN HYDROUS EX OINT: 1.0000 "application " | TOPICAL_OINTMENT | CUTANEOUS | Status: DC | PRN

## 2013-03-22 MED ORDER — SCOPOLAMINE 1 MG/3DAYS TD PT72
1.0000 | MEDICATED_PATCH | Freq: Once | TRANSDERMAL | Status: DC
  Administered 2013-03-22: 1.5 mg via TRANSDERMAL

## 2013-03-22 MED ORDER — BUPIVACAINE HCL (PF) 0.25 % IJ SOLN: INTRAMUSCULAR | Status: DC | PRN

## 2013-03-22 MED ORDER — MORPHINE SULFATE (PF) 0.5 MG/ML IJ SOLN
INTRAMUSCULAR | Status: DC | PRN
  Administered 2013-03-22: 2 mg via INTRAVENOUS

## 2013-03-22 MED ORDER — OXYTOCIN 10 UNIT/ML IJ SOLN
INTRAMUSCULAR | Status: AC
  Filled 2013-03-22: qty 4

## 2013-03-22 MED ORDER — MORPHINE SULFATE 0.5 MG/ML IJ SOLN
INTRAMUSCULAR | Status: AC
  Filled 2013-03-22: qty 10

## 2013-03-22 SURGICAL SUPPLY — 30 items
CLAMP CORD UMBIL (MISCELLANEOUS) ×1 IMPLANT
CLOTH BEACON ORANGE TIMEOUT ST (SAFETY) ×2 IMPLANT
DRAPE LG THREE QUARTER DISP (DRAPES) ×3 IMPLANT
DRSG OPSITE POSTOP 4X10 (GAUZE/BANDAGES/DRESSINGS) ×2 IMPLANT
DURAPREP 26ML APPLICATOR (WOUND CARE) ×2 IMPLANT
ELECT REM PT RETURN 9FT ADLT (ELECTROSURGICAL) ×2
ELECTRODE REM PT RTRN 9FT ADLT (ELECTROSURGICAL) ×1 IMPLANT
EXTRACTOR VACUUM M CUP 4 TUBE (SUCTIONS) IMPLANT
GLOVE BIO SURGEON STRL SZ 6.5 (GLOVE) ×2 IMPLANT
GLOVE BIOGEL PI IND STRL 6.5 (GLOVE) ×1 IMPLANT
GLOVE BIOGEL PI INDICATOR 6.5 (GLOVE) ×1
GOWN STRL REIN XL XLG (GOWN DISPOSABLE) ×4 IMPLANT
KIT ABG SYR 3ML LUER SLIP (SYRINGE) ×1 IMPLANT
NDL HYPO 25X5/8 SAFETYGLIDE (NEEDLE) IMPLANT
NEEDLE HYPO 25X5/8 SAFETYGLIDE (NEEDLE) ×2 IMPLANT
NS IRRIG 1000ML POUR BTL (IV SOLUTION) ×2 IMPLANT
PACK C SECTION WH (CUSTOM PROCEDURE TRAY) ×2 IMPLANT
PAD OB MATERNITY 4.3X12.25 (PERSONAL CARE ITEMS) ×2 IMPLANT
RTRCTR C-SECT PINK 25CM LRG (MISCELLANEOUS) ×2 IMPLANT
STAPLER VISISTAT 35W (STAPLE) ×1 IMPLANT
SUT MON AB 2-0 CT1 27 (SUTURE) ×2 IMPLANT
SUT MON AB 4-0 PS1 27 (SUTURE) IMPLANT
SUT PDS AB 0 CTX 60 (SUTURE) IMPLANT
SUT PLAIN 2 0 XLH (SUTURE) IMPLANT
SUT VIC AB 0 CTX 36 (SUTURE) ×8
SUT VIC AB 0 CTX36XBRD ANBCTRL (SUTURE) ×4 IMPLANT
SUT VIC AB 4-0 KS 27 (SUTURE) IMPLANT
TOWEL OR 17X24 6PK STRL BLUE (TOWEL DISPOSABLE) ×2 IMPLANT
TRAY FOLEY CATH 14FR (SET/KITS/TRAYS/PACK) ×1 IMPLANT
WATER STERILE IRR 1000ML POUR (IV SOLUTION) ×1 IMPLANT

## 2013-03-22 NOTE — Brief Op Note (Signed)
03/21/2013 - 03/22/2013  8:59 PM  PATIENT:  Lori Grimes  22 y.o. female  PRE-OPERATIVE DIAGNOSIS:  failure to descend, maternal exhaustion  POST-OPERATIVE DIAGNOSIS:  failure to descend, maternal exhaustion  PROCEDURE:  Procedure(s): Primary Cesarean Section with delivery of baby    @; Apgars  (N/A)  SURGEON:  Surgeon(s) and Role:    Philip Aspen, DO - Primary  ANESTHESIA:   epidural  EBL:  Total I/O In: 1500 [I.V.:1500] Out: 900 [Urine:100; Blood:800]  SPECIMEN:  Source of Specimen:  placenta  DISPOSITION OF SPECIMEN:  PATHOLOGY  COUNTS:  YES  FINDINGS: female infant, cephalic, thick meconium, APGARS 8/9.  Normal uterus, ovaries and tubes bilaterally

## 2013-03-22 NOTE — Anesthesia Procedure Notes (Signed)
Epidural Patient location during procedure: OB Start time: 03/22/2013 8:04 AM  Staffing Anesthesiologist: Lyndie Vanderloop A. Performed by: anesthesiologist   Preanesthetic Checklist Completed: patient identified, site marked, surgical consent, pre-op evaluation, timeout performed, IV checked, risks and benefits discussed and monitors and equipment checked  Epidural Patient position: sitting Prep: site prepped and draped and DuraPrep Patient monitoring: continuous pulse ox and blood pressure Approach: midline Injection technique: LOR air  Needle:  Needle type: Tuohy  Needle gauge: 17 G Needle length: 9 cm and 9 Needle insertion depth: 7 cm Catheter type: closed end flexible Catheter size: 19 Gauge Catheter at skin depth: 12 cm Test dose: negative and Other  Assessment Events: blood not aspirated, injection not painful, no injection resistance, negative IV test and no paresthesia  Additional Notes Patient identified. Risks and benefits discussed including failed block, incomplete  Pain control, post dural puncture headache, nerve damage, paralysis, blood pressure Changes, nausea, vomiting, reactions to medications-both toxic and allergic and post Partum back pain. All questions were answered. Patient expressed understanding and wished to proceed. Sterile technique was used throughout procedure. Epidural site was Dressed with sterile barrier dressing. No paresthesias, signs of intravascular injection Or signs of intrathecal spread were encountered.  Patient was more comfortable after the epidural was dosed. Please see RN's note for documentation of vital signs and FHR which are stable.

## 2013-03-22 NOTE — Anesthesia Postprocedure Evaluation (Signed)
  Anesthesia Post-op Note  Anesthesia Post Note  Patient: Lori Grimes  Procedure(s) Performed: Procedure(s) (LRB): Primary Cesarean Section with delivery of baby    @; Apgars  (N/A)  Anesthesia type: Epidural  Patient location: PACU  Post pain: Pain level controlled  Post assessment: Post-op Vital signs reviewed  Last Vitals:  Filed Vitals:   03/22/13 2145  BP: 104/36  Pulse: 88  Temp:   Resp: 26    Post vital signs: stable  Level of consciousness: awake  Complications: No apparent anesthesia complications

## 2013-03-22 NOTE — Progress Notes (Signed)
FHTs NST R SVE 6/C/-2; attempted to place IUPC and unable secondary pain.  Continue pit- no epidural desired yet.

## 2013-03-22 NOTE — Lactation Note (Signed)
This note was copied from the chart of Lori Thuy Atilano. Lactation Consultation Note  Patient Name: Lori Grimes [id number] Date: 03/22/2013 Reason for consult: Initial assessment;Difficult latch;Other (Comment) (probable LGA baby; crying but unable to latch).  RN, beth and PACU, RN, Truddie Hidden had both been assisting with attempts to latch but baby crying and able to suck on gloved finger but not on breast.  Beth reports trying both breasts and finding the (R) nipple flat.  At time of LC assessment, Baby is pink and when placed in football position on (R), is able to latch after a few attempts and mom's nipple does evert slightly but is short and thick.  Breasts are large but soft and compressible and LC able to provide steady breast support and alternating breast compression for full 17 minute feeding.  Once latched, LC observed rhythmical sucking bursts and heard intermittent swallows and baby sustained latch without breast support at end of feeding.  Despite need for multiple latch attempts, this feeding would receive an improved LATCH score=8 because of steady sucking bursts, audible swallows, nipple everting slightly and no pain and LC helping but able to show parents techniques for latch.  Due to baby weighing 9#14 and needing blood sugar monitoring, LC fitted mom for #24 NS and provided to RN to give night shift staff, for use if needed.  LC reviewed temporary use of NS with parents, if needed.  LC demonstrated hand expression and multiple drops obtained several times.  LC encouraged cue feedings and frequent STS. LC encouraged review of Baby and Me pp 14 and 20-25 for STS and BF information. LC provided [facility] Resource brochure and reviewed [facility] services for breastfeeding families.     Maternal Data    Feeding Feeding Type: Breast Fed Length of feed: 17 min  LATCH Score/Interventions Latch: Repeated attempts needed to sustain latch, nipple held in mouth throughout feeding, stimulation needed to  elicit sucking reflex. (lactation RN in to help breast feed) Intervention(s): Adjust position;Assist with latch;Breast massage;Breast compression  Audible Swallowing: A few with stimulation Intervention(s): Skin to skin;Hand expression  Type of Nipple: Flat (short but everts with stimulation)  Comfort (Breast/Nipple): Soft / non-tender     Hold (Positioning): Assistance needed to correctly position infant at breast and maintain latch.  LATCH Score: 6  (latch score improved to "8" with progression of feeding)  Lactation Tools Discussed/Used Tools: Nipple Shields Nipple shield size: 24;Other (comment) (provided for future use, if needed; baby latched well without NS)   Consult Status Consult Status: Follow-up Date: 03/23/13 Follow-up type: In-patient    Warrick Paris[facility] 03/22/2013, 10:29 PM

## 2013-03-22 NOTE — Preoperative (Signed)
Beta Blockers   Reason not to administer Beta Blockers:Not Applicable 

## 2013-03-22 NOTE — Progress Notes (Signed)
Offered to do VE, pt doesn't want VE at this time.

## 2013-03-22 NOTE — Consult Note (Signed)
Neonatology Note:   Attendance at C-section:    I was asked by Dr. Claiborne Billings to attend this primary C/S at term due to failure of descent. The mother is a G4P0A3 B pos, GBS neg with onset of fever as high as 102 degrees beginning about 6 hours before delivery. She received Clindamycin and Gentamicin 6 hours before delivery. ROM 36 hours prior to delivery, fluid clear initially until found to have thick meconium at C/S. Infant had good tone and normal HR at birth; we quickly bulb suctioned prior to the first cry, getting large amounts of dark green fluid from the throat and nares. The baby cried vigorously after that and pinked up quickly. We did DeLee suctioning to the stomach and only got another 2 ml of lighter green mucous. Ap 8/9. Lungs clear to ausc in DR. To CN to care of Pediatrician.   Doretha Sou, MD

## 2013-03-22 NOTE — Progress Notes (Signed)
Pt comfortable, feels hot. FHT 145 R TOCO q2 SVE: deferred - recent RN exam 8+cm Temp 101.2 Will switch to Gent/Clinda for chorioamnionitis treatment Continue pitocin, not yet adequate

## 2013-03-22 NOTE — Transfer of Care (Signed)
Immediate Anesthesia Transfer of Care Note  Patient: Lori Grimes  Procedure(s) Performed: Procedure(s): Primary Cesarean Section with delivery of baby    @; Apgars  (N/A)  Patient Location: PACU  Anesthesia Type:Epidural  Level of Consciousness: awake  Airway & Oxygen Therapy: Patient Spontanous Breathing  Post-op Assessment: Report given to PACU RN  Post vital signs: Reviewed and stable  Complications: No apparent anesthesia complications

## 2013-03-22 NOTE — Progress Notes (Signed)
Dr. Henderson Cloud into see pt & eval status.  MD attempted to insert IUPC, unable to do so d/t pt's discomfort during VE.

## 2013-03-22 NOTE — Anesthesia Preprocedure Evaluation (Addendum)
Anesthesia Evaluation  Patient identified by MRN, date of birth, ID band Patient awake    Reviewed: Allergy & Precautions, H&P , Patient's Chart, lab work & pertinent test results  Airway Mallampati: III TM Distance: >3 FB Neck ROM: Full    Dental no notable dental hx. (+) Teeth Intact   Pulmonary former smoker,  breath sounds clear to auscultation  Pulmonary exam normal       Cardiovascular negative cardio ROS  Rhythm:Regular Rate:Normal     Neuro/Psych  Headaches, negative psych ROS   GI/Hepatic GERD-  Medicated and Controlled,  Endo/Other  Obesity  Renal/GU Renal diseaseHx/o Renal Calculi  negative genitourinary   Musculoskeletal negative musculoskeletal ROS (+)   Abdominal (+) + obese,   Peds  Hematology Factor V Leiden Mutation- Heterozygous Asymptomatic, on Baby ASA qd No thrombotic episodes in past   Anesthesia Other Findings   Reproductive/Obstetrics (+) Pregnancy (failure to progress --> C/S)                          Anesthesia Physical Anesthesia Plan  ASA: II and emergent  Anesthesia Plan: Epidural   Post-op Pain Management:    Induction:   Airway Management Planned: Natural Airway  Additional Equipment:   Intra-op Plan:   Post-operative Plan:   Informed Consent: I have reviewed the patients History and Physical, chart, labs and discussed the procedure including the risks, benefits and alternatives for the proposed anesthesia with the patient or authorized representative who has indicated his/her understanding and acceptance.     Plan Discussed with: Anesthesiologist, Surgeon and CRNA  Anesthesia Plan Comments:        Anesthesia Quick Evaluation

## 2013-03-22 NOTE — Progress Notes (Signed)
ANTIBIOTIC CONSULT NOTE - INITIAL  Pharmacy Consult for Gentamicin Indication: Chorioamnionitis  Allergies  Allergen Reactions  . 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 Penicillins Other (See Comments)    Pt states "causes gums to become sore and inflamed"    Patient Measurements: Height: 5\' 3"  (160 cm) Weight: 205 lb 3.2 oz (93.078 kg) IBW/kg (Calculated) : 52.4kg Adjusted Body Weight: 65 kg  Vital Signs: Temp: 101.2 F (38.4 C) (11/05 1404) Temp src: Axillary (11/05 1404) BP: 115/63 mmHg (11/05 1401) Pulse Rate: 129 (11/05 1401)  Labs:  Recent Labs  03/21/13 0935  WBC 12.6*  HGB 12.1  PLT 199     Medications:  Ancef 2 grams IV Q 8 hr - discontinued Clindamycin 900 mg IV Q 8 hr  Assessment: 22 y.o. female G4P0030 at [redacted]w[redacted]d with maternal temp Estimated Ke = 0.307, Vd = 0.34 L/kg  Goal of Therapy:  Gentamicin peak 6-8 mg/L and Trough < 1 mg/L  Plan:  Gentamicin 140 mg IV every 8 hrs  Check Scr with next labs if gentamicin continued. Will check gentamicin levels if continued > 72hr or clinically indicated.  Natasha Bence 03/22/2013,2:18 PM

## 2013-03-22 NOTE — Progress Notes (Signed)
Pt comfortable s/p epidural. FHT 135R TOCO q2-4 SVE 7.5/90/-1 IUPC placed, some minimal caput, could not feel cervix posteriorly Ancef 2g q6 for chorioamnionitis prophylaxis given >24hrs ruptured.  All: PCN, gum swelling Will continue to monitor. RN Victorino Dike taking care of pt yesterday and today, per RN no noticeable decent of head since yesterday.

## 2013-03-23 ENCOUNTER — Encounter (HOSPITAL_COMMUNITY): Payer: Self-pay | Admitting: *Deleted

## 2013-03-23 LAB — CBC
HCT: 32.7 % — ABNORMAL LOW (ref 36.0–46.0)
MCH: 29.9 pg (ref 26.0–34.0)
MCV: 86.5 fL (ref 78.0–100.0)
Platelets: 181 10*3/uL (ref 150–400)
RBC: 3.78 MIL/uL — ABNORMAL LOW (ref 3.87–5.11)
WBC: 16.9 10*3/uL — ABNORMAL HIGH (ref 4.0–10.5)

## 2013-03-23 MED ORDER — NALOXONE HCL 1 MG/ML IJ SOLN
1.0000 ug/kg/h | INTRAMUSCULAR | Status: DC | PRN
  Filled 2013-03-23: qty 2

## 2013-03-23 MED ORDER — SODIUM CHLORIDE 0.9 % IJ SOLN: 3.0000 mL | INTRAMUSCULAR | Status: DC | PRN

## 2013-03-23 MED ORDER — ONDANSETRON HCL 4 MG/2ML IJ SOLN: 4.0000 mg | Freq: Three times a day (TID) | INTRAMUSCULAR | Status: DC | PRN

## 2013-03-23 MED ORDER — DIPHENHYDRAMINE HCL 50 MG/ML IJ SOLN: 25.0000 mg | INTRAMUSCULAR | Status: DC | PRN

## 2013-03-23 MED ORDER — DIPHENHYDRAMINE HCL 50 MG/ML IJ SOLN: 12.5000 mg | INTRAMUSCULAR | Status: DC | PRN

## 2013-03-23 MED ORDER — DIPHENHYDRAMINE HCL 25 MG PO CAPS: 25.0000 mg | ORAL_CAPSULE | ORAL | Status: DC | PRN

## 2013-03-23 MED ORDER — NALOXONE HCL 0.4 MG/ML IJ SOLN: 0.4000 mg | INTRAMUSCULAR | Status: DC | PRN

## 2013-03-23 MED ORDER — NALBUPHINE HCL 10 MG/ML IJ SOLN
5.0000 mg | INTRAMUSCULAR | Status: DC | PRN
  Filled 2013-03-23: qty 1

## 2013-03-23 MED ORDER — METOCLOPRAMIDE HCL 5 MG/ML IJ SOLN: 10.0000 mg | Freq: Three times a day (TID) | INTRAMUSCULAR | Status: DC | PRN

## 2013-03-23 NOTE — Progress Notes (Signed)
Pt unable to void post foley removal. Pt States "I feel like I have to but can't". Bladder scanned with result of . Pt wanted to try to use bathroom again. Voided with mostly blood. I/o cath per protocol. Received 375 ml. Encouraged patient to drink plenty of fluids and try to void every hour while awake. Will continue to monitor.

## 2013-03-23 NOTE — Anesthesia Postprocedure Evaluation (Signed)
  Anesthesia Post-op Note  Patient: Lori Grimes  Procedure(s) Performed: Procedure(s): Primary Cesarean Section with delivery of baby    @; Apgars  (N/A)  Patient Location: Mother/Baby  Anesthesia Type:Epidural  Level of Consciousness: awake, alert , oriented and patient cooperative  Airway and Oxygen Therapy: Patient Spontanous Breathing  Post-op Pain: mild  Post-op Assessment: Patient's Cardiovascular Status Stable, Respiratory Function Stable, No headache, No backache, No residual numbness and No residual motor weakness  Post-op Vital Signs: stable  Complications: No apparent anesthesia complications

## 2013-03-23 NOTE — Progress Notes (Addendum)
  Patient is eating, ambulating, foley still in.  Pain control is good.  Filed Vitals:   03/23/13 0440 03/23/13 0442 03/23/13 0445 03/23/13 0627  BP: 118/76 98/67 88/60  109/62  Pulse: 69 98 101 83  Temp: 98 F (36.7 C)   98.2 F (36.8 C)  TempSrc: Oral   Oral  Resp: 18   20  Height:      Weight:      SpO2: 97%   97%    lungs:   clear to auscultation cor:    RRR Abdomen:  soft, appropriate tenderness, incisions intact and without erythema or exudate ex:    no cords   Lab Results  Component Value Date   WBC 16.9* 03/23/2013   HGB 11.3* 03/23/2013   HCT 32.7* 03/23/2013   MCV 86.5 03/23/2013   PLT 181 03/23/2013    --/--/B POS, B POS (11/04 0935)/RI  A/P    Post operative day 1.  Routine post op and postpartum care.  Expect d/c tomorrow.  Percocet for pain control.

## 2013-03-24 NOTE — Op Note (Signed)
NAME:  Lori Grimes, SULA NO.:  [id number]  MEDICAL RECORD NO.:  [id number]  LOCATION:  9145                          FACILITY:  [facility]  PHYSICIAN:  Philip Aspen, DO    DATE OF BIRTH:  10-26-1990  DATE OF PROCEDURE: DATE OF DISCHARGE:                              OPERATIVE REPORT   PREOPERATIVE DIAGNOSIS:  Failure to descent, maternal exhaustion.  POSTOPERATIVE DIAGNOSIS:  Failure to descent, maternal exhaustion.  PROCEDURE:  Primary low transverse cesarean section.  SURGEON:  Philip Aspen, DO  ANESTHESIA:  Epidural.  ESTIMATED BLOOD LOSS:  800 mL.  URINE:  100 mL of clear and the Foley drainage.  IV FLUIDS:  1500 mL.  SPECIMEN:  Placenta to pathology.  FINDINGS:  Female infant in cephalic presentation with thick meconium. Apgar's of 8 and 9.  Normal tubes and ovaries, and uterus bilaterally. Weight of 9 pounds 14 ounces.  DESCRIPTION OF PROCEDURE:  The patient was taken to the operating room, where epidural anesthesia was found to be adequate.  She was prepped and draped in the normal sterile fashion in dorsal supine position.  A Pfannenstiel skin incision was made with scalpel and carried down to the underlying layer of fascia with Bovie cautery.  Fascia was incised in the midline with a scalpel and extended laterally with Mayo scissors. Kocher clamps were placed at the superior aspect of the fascial incision, which was dissected off bluntly and sharply.  The Kocher clamps were then placed at the inferior aspect of the fascial incision was tented and the rectus muscles dissected off bluntly and sharply. The rectus muscles were separated in the midline, and hemostats were used to tent the peritoneum which was entered sharply with good visualization.  The peritoneal incision was extended laterally with manual traction, and the abdomen was surveyed.  Uterus appeared normal. No other abnormalities noted.  Alexis self retractor was placed and  a bladder flap was then created with Metzenbaum scissors, and developed digitally.  A scalpel used to make a low transverse uterine incision and the amniotic sac was entered bluntly.  Thick meconium which was abundant, emanated from the incision.  The infant head was located, elevated, and delivered without difficulty followed by the remainder of its body.  The cord was clamped and the infant was immediately handed off to awaiting Neonatology.  Cord blood was collected, and the uterus was massaged externally with gentle traction on the umbilical cord for placental removal.  The uterus remained boggy and anesthesia was asked to administer 1 dose of methargen which aided in firming of the uterus. The uterine incision was grasped with T clamps and closed with Vicryl in a running, locked fashion.  The second layer of Vicryl suture was placed in the horizontal Lambert imbrication.  Excellent hemostasis was noted. The Alexis self retractor was removed.  Bladder blade and Richardson retractors were used to visualize both ovaries and tubes and again visualized the incision which remained hemostatic.  Blood and fluid was removed from both gutters and peritoneal incision was closed with Monocryl in a running fashion.  The fascia was reapproximated and closed in a running fashion.  The subcutaneous tissue was irrigated, dried, and found  to be hemostatic with minimal use of Bovie cautery.  The skin was then reapproximated and closed with staples.  The patient tolerated the procedure well.  Sponge, lap, and needle counts were correct x2.  The patient was taken to recovery in stable condition.          ______________________________ Philip Aspen, DO     [place]/MEDQ  D:  03/23/2013  T:  03/24/2013  Job:  [id number]

## 2013-03-24 NOTE — Progress Notes (Signed)
Patient is eating, ambulating, voiding.  Pain control is good.  Appropriate lochia.  No complaints.  Filed Vitals:   03/23/13 0830 03/23/13 1215 03/23/13 1745 03/24/13 0522  BP: 111/64 110/61 106/71 106/60  Pulse: 82 87 92 87  Temp: 98.5 F (36.9 C) 98.4 F (36.9 C) 98 F (36.7 C) 97.9 F (36.6 C)  TempSrc: Oral Oral Oral Oral  Resp: 18 18 18 17   Height:      Weight:      SpO2: 96% 95% 100%     Fundus firm Perineum without swelling. Inc: c/d/i Ext: no CT  Lab Results  Component Value Date   WBC 16.9* 03/23/2013   HGB 11.3* 03/23/2013   HCT 32.7* 03/23/2013   MCV 86.5 03/23/2013   PLT 181 03/23/2013    --/--/B POS, B POS (11/04 0935)  A/P Post op day #2 s/p c/s for FTD, chorioamnionitis.  Routine care.  Expect d/c 11/8.    Lori Grimes

## 2013-03-25 MED ORDER — OXYCODONE-ACETAMINOPHEN 5-325 MG PO TABS: 1.0000 | ORAL_TABLET | ORAL | Status: DC | PRN

## 2013-03-25 MED ORDER — IBUPROFEN 600 MG PO TABS: 600.0000 mg | ORAL_TABLET | Freq: Four times a day (QID) | ORAL | Status: DC

## 2013-03-25 NOTE — Discharge Summary (Signed)
Obstetric Discharge Summary Reason for Admission: rupture of membranes Prenatal Procedures: ultrasound Intrapartum Procedures: cesarean: low cervical, transverse Postpartum Procedures: antibiotics Complications-Operative and Postpartum: none Hemoglobin  Date Value Range Status  03/23/2013 11.3* 12.0 - 15.0 g/dL Final     HCT  Date Value Range Status  03/23/2013 32.7* 36.0 - 46.0 % Final    Physical Exam:  General: alert and cooperative Lochia: appropriate Uterine Fundus: firm Incision: healing well, no significant drainage, no significant erythema DVT Evaluation: No evidence of DVT seen on physical exam.  Discharge Diagnoses: Term Pregnancy-delivered  Discharge Information: Date: 03/25/2013 Activity: pelvic rest Diet: routine Medications: PNV, Ibuprofen and Percocet Condition: stable Instructions: refer to practice specific booklet Discharge to: home Follow-up Information   Follow up with Nikolis Berent, DO In 2 weeks.   Specialty:  Obstetrics and Gynecology   Contact information:   [address] [place] [address] [phone]       Newborn Data: Live born female  Birth Weight: 9 lb 14.6 oz (4495 g) APGAR: 8, 9  Home with mother.  Philip Aspen 03/25/2013, 9:10 AM

## 2013-05-05 ENCOUNTER — Other Ambulatory Visit: Payer: Self-pay

## 2013-09-24 IMAGING — CR DG LUMBAR SPINE COMPLETE 4+V
5 series · 5 of 5 positions shown · non-contrast
Comparison: None

CLINICAL DATA: Injured back.

LUMBAR SPINE - COMPLETE 4+ VIEW

[t lumbar spine ap]
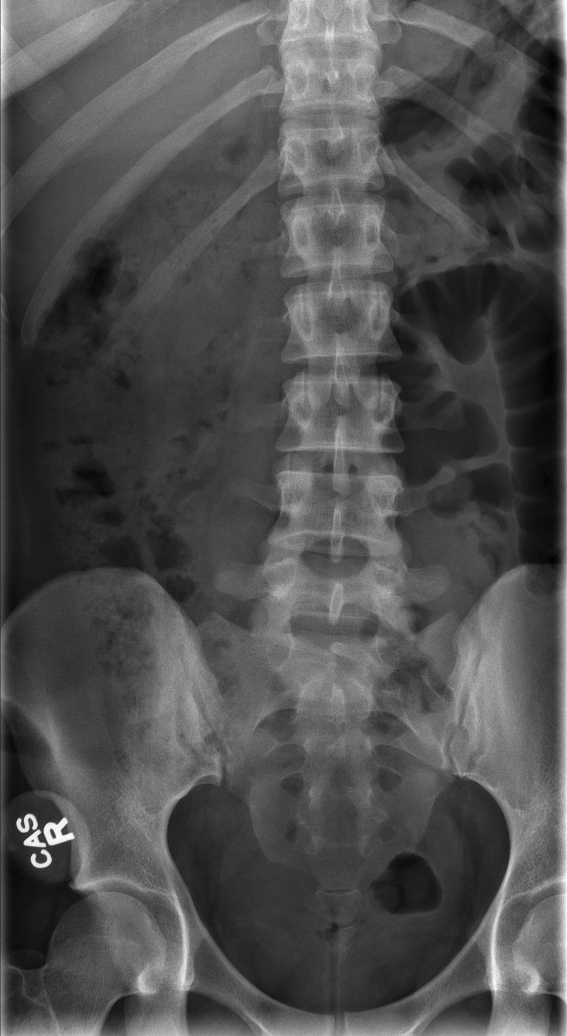

[t lumbar spine obl (1 of 2)]
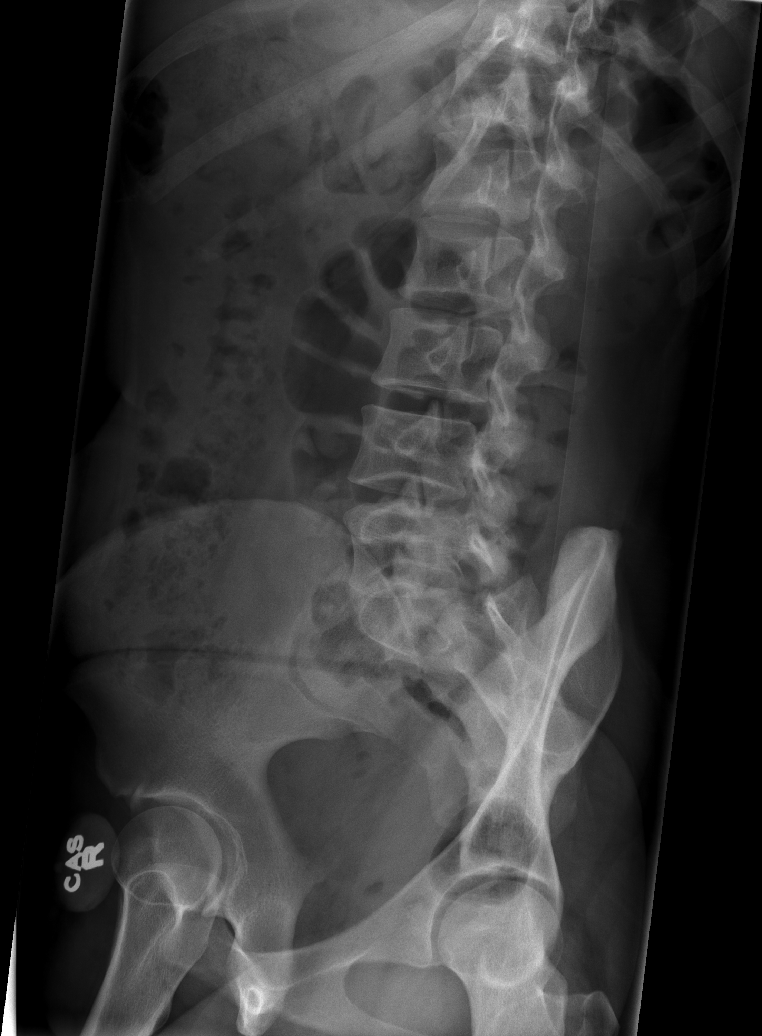

[t lumbar spine obl (2 of 2)]
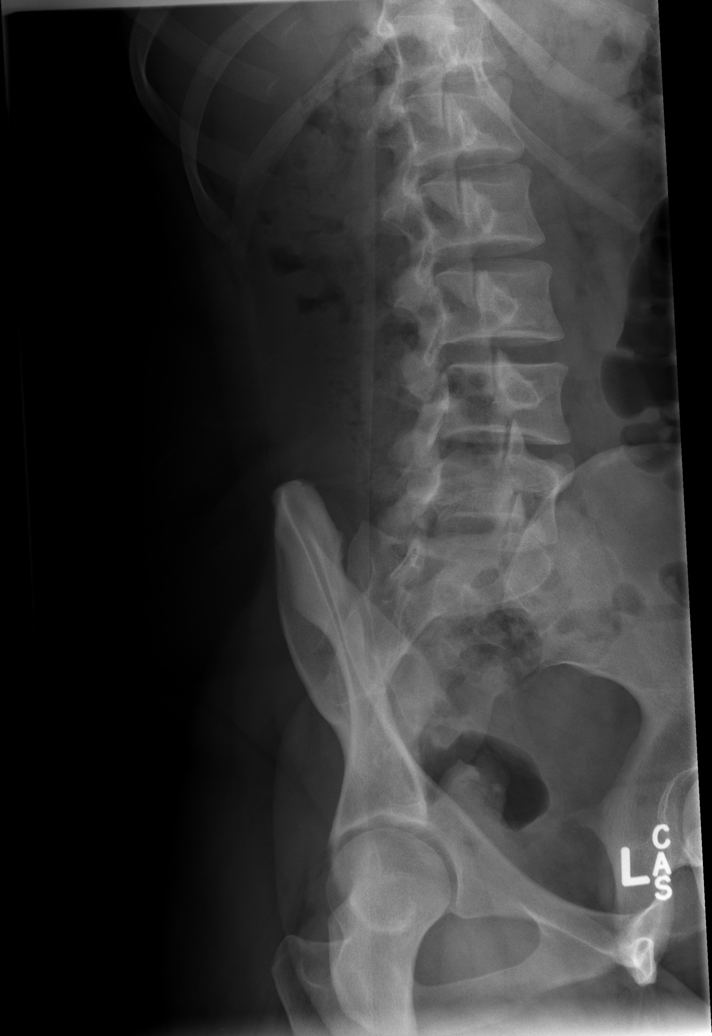

[t lumbar spine lat]
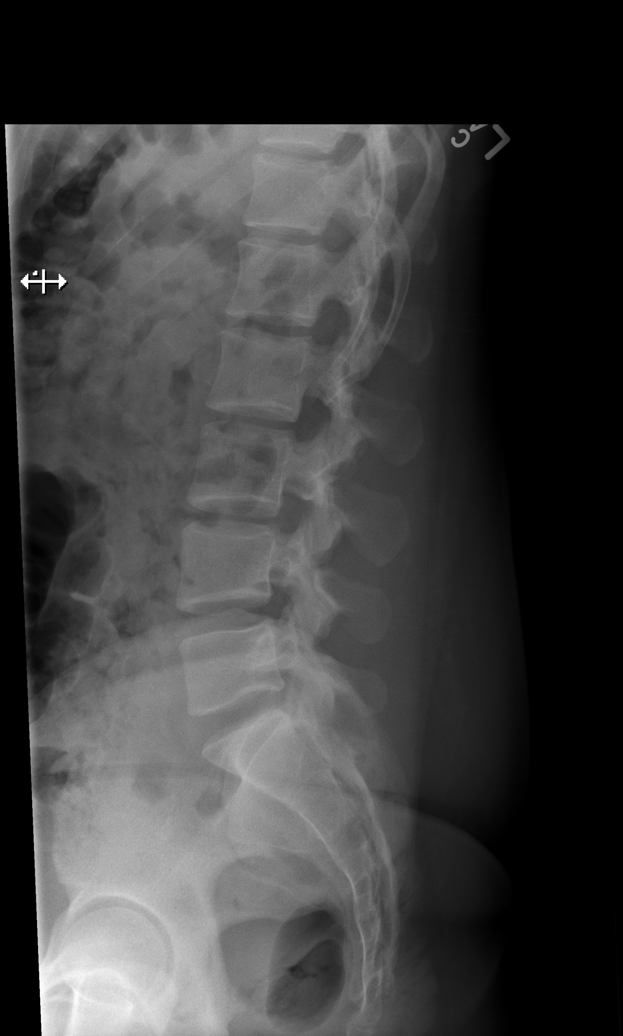

[t lumbar l-5 s-1 spot]
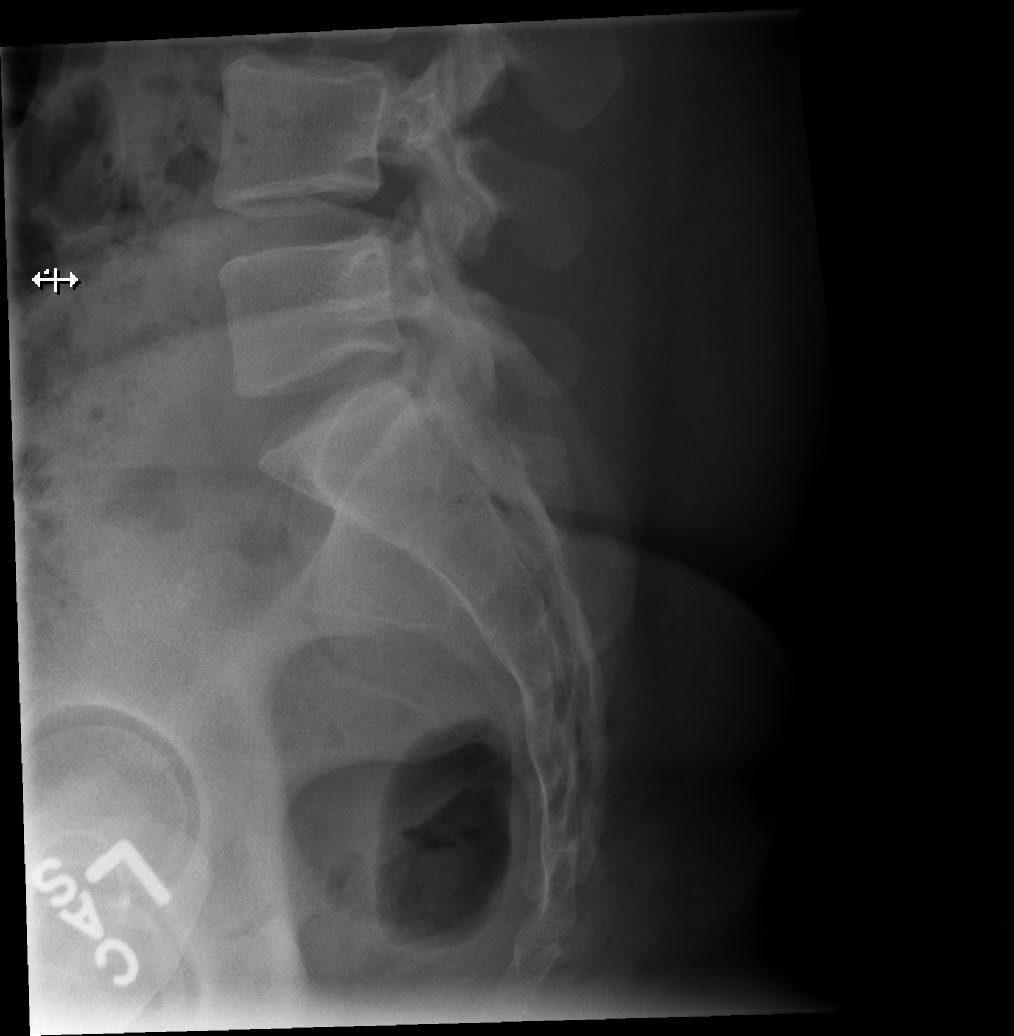

[5 of 5 positions shown; findings below may reference images not displayed]

FINDINGS: The lateral film demonstrates normal alignment.
Vertebral bodies and disc spaces are maintained.  No acute bony
findings.  Normal alignment of the facet joints and no pars
defects.  The visualized bony pelvis in intact.
IMPRESSION: Normal alignment and no acute bony findings.

## 2014-03-19 ENCOUNTER — Encounter (HOSPITAL_COMMUNITY): Payer: Self-pay | Admitting: *Deleted

## 2014-08-16 ENCOUNTER — Encounter: Payer: Self-pay | Admitting: Obstetrics and Gynecology

## 2017-02-02 LAB — OB RESULTS CONSOLE GC/CHLAMYDIA
Chlamydia: NEGATIVE
Gonorrhea: NEGATIVE

## 2017-02-02 LAB — OB RESULTS CONSOLE ABO/RH: RH Type: POSITIVE

## 2017-02-02 LAB — OB RESULTS CONSOLE RUBELLA ANTIBODY, IGM: Rubella: IMMUNE

## 2017-02-02 LAB — OB RESULTS CONSOLE RPR: RPR: NONREACTIVE

## 2017-02-02 LAB — OB RESULTS CONSOLE ANTIBODY SCREEN: Antibody Screen: NEGATIVE

## 2017-02-02 LAB — OB RESULTS CONSOLE HEPATITIS B SURFACE ANTIGEN: Hepatitis B Surface Ag: NEGATIVE

## 2017-02-02 LAB — OB RESULTS CONSOLE HIV ANTIBODY (ROUTINE TESTING): HIV: NONREACTIVE

## 2017-06-14 ENCOUNTER — Other Ambulatory Visit: Payer: Self-pay | Admitting: Obstetrics and Gynecology

## 2017-07-09 LAB — OB RESULTS CONSOLE GBS: STREP GROUP B AG: NEGATIVE

## 2017-07-16 ENCOUNTER — Other Ambulatory Visit: Payer: Self-pay | Admitting: Obstetrics and Gynecology

## 2017-07-26 ENCOUNTER — Encounter (HOSPITAL_COMMUNITY): Payer: Self-pay | Admitting: *Deleted

## 2017-07-27 ENCOUNTER — Telehealth (HOSPITAL_COMMUNITY): Payer: Self-pay | Admitting: *Deleted

## 2017-07-27 NOTE — Telephone Encounter (Signed)
Preadmission screen  

## 2017-07-27 NOTE — Pre-Procedure Instructions (Signed)
With first phone call The person reached identified himself as her soon to be ex-husband.  Stated they were in divorce proceedings.  I identified myself as a Engineer, civil (consulting)nurse.  I did not say who I worked for or any identifying information to maintain HIPPA.  He provided me with her correct number and I placed a call to that number and left a voicemail.

## 2017-07-29 ENCOUNTER — Telehealth (HOSPITAL_COMMUNITY): Payer: Self-pay | Admitting: *Deleted

## 2017-07-29 NOTE — Telephone Encounter (Signed)
Preadmission screen  

## 2017-07-30 ENCOUNTER — Telehealth (HOSPITAL_COMMUNITY): Payer: Self-pay | Admitting: *Deleted

## 2017-07-30 NOTE — Telephone Encounter (Signed)
Preadmission screen  

## 2017-08-02 ENCOUNTER — Telehealth (HOSPITAL_COMMUNITY): Payer: Self-pay | Admitting: *Deleted

## 2017-08-02 NOTE — Telephone Encounter (Signed)
Preadmission screen  

## 2017-08-03 ENCOUNTER — Telehealth (HOSPITAL_COMMUNITY): Payer: Self-pay | Admitting: *Deleted

## 2017-08-03 NOTE — Telephone Encounter (Signed)
Preadmission screen  

## 2017-08-04 ENCOUNTER — Telehealth (HOSPITAL_COMMUNITY): Payer: Self-pay | Admitting: *Deleted

## 2017-08-04 NOTE — Telephone Encounter (Signed)
Preadmission screen  

## 2017-08-05 ENCOUNTER — Encounter (HOSPITAL_COMMUNITY): Payer: Self-pay

## 2017-08-05 NOTE — Patient Instructions (Signed)
Lori Grimes  08/05/2017   Your procedure is scheduled on:  08/09/2017  [facility]  Pick up the phone at the desk and dial [phone]  Call this number if you have problems the morning of [phone]  Remember:   Do not eat food:(After Midnight) Desps de medianoche.  Do not drink clear liquids: (After Midnight) Desps de medianoche.  Take these medicines the morning of surgery with A SIP OF WATER: NONE   Do not wear jewelry, make-up or nail polish.  Do not wear lotions, powders, or perfumes. Do not wear deodorant.  Do not shave 48 hours prior to surgery.  Do not bring valuables to the hospital.  [facility]   responsible for any belongings or valuables brought to the hospital.  Contacts, dentures or bridgework may not be worn into surgery.  Leave suitcase in the car. After surgery it may be brought to your room.  For patients admitted to the hospital, checkout time is 11:00 AM the day of              discharge.    N/A   Please read over the following fact sheets that you were given:   Surgical Site Infection Prevention

## 2017-08-06 ENCOUNTER — Encounter (HOSPITAL_COMMUNITY)
Admission: RE | Admit: 2017-08-06 | Discharge: 2017-08-06 | Disposition: A | Discharge status: Home / Self Care | Payer: Medicaid Other | Source: Ambulatory Visit | Attending: Obstetrics and Gynecology | Admitting: Obstetrics and Gynecology

## 2017-08-06 LAB — TYPE AND SCREEN
ABO/RH(D): B POS
Antibody Screen: NEGATIVE

## 2017-08-06 LAB — CBC
HEMATOCRIT: 35.1 % — AB (ref 36.0–46.0)
HEMOGLOBIN: 12.1 g/dL (ref 12.0–15.0)
MCH: 30.5 pg (ref 26.0–34.0)
MCHC: 34.5 g/dL (ref 30.0–36.0)
MCV: 88.4 fL (ref 78.0–100.0)
Platelets: 250 10*3/uL (ref 150–400)
RBC: 3.97 MIL/uL (ref 3.87–5.11)
RDW: 13.3 % (ref 11.5–15.5)
WBC: 13.9 10*3/uL — ABNORMAL HIGH (ref 4.0–10.5)

## 2017-08-07 LAB — RPR: RPR: NONREACTIVE

## 2017-08-08 ENCOUNTER — Encounter (HOSPITAL_COMMUNITY): Payer: Self-pay | Admitting: Anesthesiology

## 2017-08-08 NOTE — Anesthesia Preprocedure Evaluation (Addendum)
Anesthesia Evaluation  Patient identified by MRN, date of birth, ID band Patient awake    Reviewed: Allergy & Precautions, NPO status , Patient's Chart, lab work & pertinent test results  Airway Mallampati: III  TM Distance: >3 FB Neck ROM: Full    Dental no notable dental hx. (+) Teeth Intact   Pulmonary former smoker,    Pulmonary exam normal breath sounds clear to auscultation       Cardiovascular negative cardio ROS Normal cardiovascular exam Rhythm:Regular Rate:Normal     Neuro/Psych  Headaches, negative psych ROS   GI/Hepatic Neg liver ROS, GERD  ,  Endo/Other  Obesity  Renal/GU Renal diseaseHx/o renal calculi  negative genitourinary   Musculoskeletal negative musculoskeletal ROS (+)   Abdominal (+) + obese,   Peds  Hematology  (+) Blood dyscrasia, , Hx/o Factor V Leiden mutation   Anesthesia Other Findings   Reproductive/Obstetrics (+) Pregnancy Previous C/Section Desires sterilization                           Anesthesia Physical Anesthesia Plan  ASA: II  Anesthesia Plan: Spinal   Post-op Pain Management:    Induction:   PONV Risk Score and Plan: 4 or greater and Scopolamine patch - Pre-op, Dexamethasone, Ondansetron and Treatment may vary due to age or medical condition  Airway Management Planned: Natural Airway  Additional Equipment:   Intra-op Plan:   Post-operative Plan:   Informed Consent: I have reviewed the patients History and Physical, chart, labs and discussed the procedure including the risks, benefits and alternatives for the proposed anesthesia with the patient or authorized representative who has indicated his/her understanding and acceptance.   Dental advisory given  Plan Discussed with: CRNA, Anesthesiologist and Surgeon  Anesthesia Plan Comments:        Anesthesia Quick Evaluation

## 2017-08-09 ENCOUNTER — Inpatient Hospital Stay (HOSPITAL_COMMUNITY)
Admission: AD | Admit: 2017-08-09 | Discharge: 2017-08-12 | DRG: 784 | Disposition: A | Discharge status: Home / Self Care | Payer: Medicaid Other | Source: Ambulatory Visit | Attending: Obstetrics and Gynecology | Admitting: Obstetrics and Gynecology

## 2017-08-09 ENCOUNTER — Other Ambulatory Visit: Payer: Self-pay

## 2017-08-09 ENCOUNTER — Encounter (HOSPITAL_COMMUNITY)
Admission: AD | Disposition: A | Discharge status: Home / Self Care | Payer: Self-pay | Source: Ambulatory Visit | Attending: Obstetrics and Gynecology

## 2017-08-09 ENCOUNTER — Inpatient Hospital Stay (HOSPITAL_COMMUNITY): Payer: Medicaid Other | Admitting: Anesthesiology

## 2017-08-09 ENCOUNTER — Encounter (HOSPITAL_COMMUNITY): Payer: Self-pay

## 2017-08-09 DIAGNOSIS — O34211 Maternal care for low transverse scar from previous cesarean delivery: Secondary | ICD-10-CM | POA: Diagnosis present

## 2017-08-09 DIAGNOSIS — O99214 Obesity complicating childbirth: Secondary | ICD-10-CM | POA: Diagnosis present

## 2017-08-09 DIAGNOSIS — Z87891 Personal history of nicotine dependence: Secondary | ICD-10-CM | POA: Diagnosis not present

## 2017-08-09 DIAGNOSIS — Z23 Encounter for immunization: Secondary | ICD-10-CM | POA: Diagnosis not present

## 2017-08-09 DIAGNOSIS — E669 Obesity, unspecified: Secondary | ICD-10-CM | POA: Diagnosis present

## 2017-08-09 DIAGNOSIS — Z880 Allergy status to penicillin: Secondary | ICD-10-CM

## 2017-08-09 DIAGNOSIS — D6851 Activated protein C resistance: Secondary | ICD-10-CM | POA: Diagnosis present

## 2017-08-09 DIAGNOSIS — Z3A39 39 weeks gestation of pregnancy: Secondary | ICD-10-CM

## 2017-08-09 DIAGNOSIS — Z302 Encounter for sterilization: Secondary | ICD-10-CM

## 2017-08-09 DIAGNOSIS — O9912 Other diseases of the blood and blood-forming organs and certain disorders involving the immune mechanism complicating childbirth: Secondary | ICD-10-CM | POA: Diagnosis present

## 2017-08-09 LAB — CBC
HCT: 33.5 % — ABNORMAL LOW (ref 36.0–46.0)
Hemoglobin: 11.7 g/dL — ABNORMAL LOW (ref 12.0–15.0)
MCH: 30.7 pg (ref 26.0–34.0)
MCHC: 34.9 g/dL (ref 30.0–36.0)
MCV: 87.9 fL (ref 78.0–100.0)
PLATELETS: 234 10*3/uL (ref 150–400)
RBC: 3.81 MIL/uL — ABNORMAL LOW (ref 3.87–5.11)
RDW: 13.3 % (ref 11.5–15.5)
WBC: 19.2 10*3/uL — ABNORMAL HIGH (ref 4.0–10.5)

## 2017-08-09 LAB — CREATININE, SERUM
CREATININE: 0.6 mg/dL (ref 0.44–1.00)
GFR calc Af Amer: >60 mL/min (ref >60)
GFR calc non Af Amer: >60 mL/min (ref >60)

## 2017-08-09 SURGERY — Surgical Case
Anesthesia: Spinal

## 2017-08-09 MED ORDER — NALBUPHINE HCL 10 MG/ML IJ SOLN: 5.0000 mg | Freq: Once | INTRAMUSCULAR | Status: DC | PRN

## 2017-08-09 MED ORDER — LACTATED RINGERS IV SOLN
INTRAVENOUS | Status: DC
  Administered 2017-08-09 (×2): via INTRAVENOUS

## 2017-08-09 MED ORDER — METOCLOPRAMIDE HCL 5 MG/ML IJ SOLN: 10.0000 mg | Freq: Once | INTRAMUSCULAR | Status: DC | PRN

## 2017-08-09 MED ORDER — TETANUS-DIPHTH-ACELL PERTUSSIS 5-2.5-18.5 LF-MCG/0.5 IM SUSP: 0.5000 mL | Freq: Once | INTRAMUSCULAR | Status: DC

## 2017-08-09 MED ORDER — DEXAMETHASONE SODIUM PHOSPHATE 4 MG/ML IJ SOLN
INTRAMUSCULAR | Status: AC
  Filled 2017-08-09: qty 1

## 2017-08-09 MED ORDER — STERILE WATER FOR IRRIGATION IR SOLN
Status: DC | PRN
  Administered 2017-08-09: 1000 mL

## 2017-08-09 MED ORDER — LACTATED RINGERS IV SOLN
INTRAVENOUS | Status: DC
  Administered 2017-08-10: 03:00:00 via INTRAVENOUS

## 2017-08-09 MED ORDER — GENTAMICIN SULFATE 40 MG/ML IJ SOLN
INTRAVENOUS | Status: DC | PRN
  Administered 2017-08-09: 340 mg via INTRAVENOUS

## 2017-08-09 MED ORDER — SCOPOLAMINE 1 MG/3DAYS TD PT72
MEDICATED_PATCH | TRANSDERMAL | Status: AC
  Filled 2017-08-09: qty 1

## 2017-08-09 MED ORDER — PHENYLEPHRINE 8 MG IN D5W 100 ML (0.08MG/ML) PREMIX OPTIME
INJECTION | INTRAVENOUS | Status: AC
  Filled 2017-08-09: qty 100

## 2017-08-09 MED ORDER — COCONUT OIL OIL: 1.0000 "application " | TOPICAL_OIL | Status: DC | PRN

## 2017-08-09 MED ORDER — OXYCODONE-ACETAMINOPHEN 5-325 MG PO TABS
2.0000 | ORAL_TABLET | ORAL | Status: DC | PRN
  Administered 2017-08-12: 2 via ORAL
  Filled 2017-08-09: qty 2

## 2017-08-09 MED ORDER — ONDANSETRON HCL 4 MG/2ML IJ SOLN: 4.0000 mg | Freq: Three times a day (TID) | INTRAMUSCULAR | Status: DC | PRN

## 2017-08-09 MED ORDER — ZOLPIDEM TARTRATE 5 MG PO TABS: 5.0000 mg | ORAL_TABLET | Freq: Every evening | ORAL | Status: DC | PRN

## 2017-08-09 MED ORDER — SIMETHICONE 80 MG PO CHEW
80.0000 mg | CHEWABLE_TABLET | ORAL | Status: DC
  Administered 2017-08-09 – 2017-08-11 (×3): 80 mg via ORAL
  Filled 2017-08-09 (×3): qty 1

## 2017-08-09 MED ORDER — OXYTOCIN 10 UNIT/ML IJ SOLN
INTRAMUSCULAR | Status: AC
  Filled 2017-08-09: qty 4

## 2017-08-09 MED ORDER — SIMETHICONE 80 MG PO CHEW: 80.0000 mg | CHEWABLE_TABLET | ORAL | Status: DC | PRN

## 2017-08-09 MED ORDER — NALOXONE HCL 4 MG/10ML IJ SOLN
1.0000 ug/kg/h | INTRAVENOUS | Status: DC | PRN
  Filled 2017-08-09: qty 5

## 2017-08-09 MED ORDER — IBUPROFEN 600 MG PO TABS
600.0000 mg | ORAL_TABLET | Freq: Four times a day (QID) | ORAL | Status: DC
  Administered 2017-08-09 – 2017-08-12 (×13): 600 mg via ORAL
  Filled 2017-08-09 (×13): qty 1

## 2017-08-09 MED ORDER — MORPHINE SULFATE (PF) 0.5 MG/ML IJ SOLN
INTRAMUSCULAR | Status: AC
  Filled 2017-08-09: qty 10

## 2017-08-09 MED ORDER — PHENYLEPHRINE 8 MG IN D5W 100 ML (0.08MG/ML) PREMIX OPTIME
INJECTION | INTRAVENOUS | Status: DC | PRN
  Administered 2017-08-09: 60 ug/min via INTRAVENOUS

## 2017-08-09 MED ORDER — BUPIVACAINE IN DEXTROSE 0.75-8.25 % IT SOLN
INTRATHECAL | Status: DC | PRN
  Administered 2017-08-09: 1.6 mL via INTRATHECAL

## 2017-08-09 MED ORDER — ONDANSETRON HCL 4 MG/2ML IJ SOLN
INTRAMUSCULAR | Status: DC | PRN
  Administered 2017-08-09: 4 mg via INTRAVENOUS

## 2017-08-09 MED ORDER — ONDANSETRON HCL 4 MG/2ML IJ SOLN
INTRAMUSCULAR | Status: AC
  Filled 2017-08-09: qty 2

## 2017-08-09 MED ORDER — FENTANYL CITRATE (PF) 100 MCG/2ML IJ SOLN
INTRAMUSCULAR | Status: DC | PRN
  Administered 2017-08-09: 20 ug via INTRATHECAL

## 2017-08-09 MED ORDER — PRENATAL MULTIVITAMIN CH
1.0000 | ORAL_TABLET | Freq: Every day | ORAL | Status: DC
  Administered 2017-08-10 – 2017-08-12 (×3): 1 via ORAL
  Filled 2017-08-09 (×3): qty 1

## 2017-08-09 MED ORDER — OXYCODONE-ACETAMINOPHEN 5-325 MG PO TABS
1.0000 | ORAL_TABLET | ORAL | Status: DC | PRN
  Administered 2017-08-10 – 2017-08-12 (×6): 1 via ORAL
  Filled 2017-08-09 (×6): qty 1

## 2017-08-09 MED ORDER — FENTANYL CITRATE (PF) 100 MCG/2ML IJ SOLN: 25.0000 ug | INTRAMUSCULAR | Status: DC | PRN

## 2017-08-09 MED ORDER — DIPHENHYDRAMINE HCL 50 MG/ML IJ SOLN: 12.5000 mg | INTRAMUSCULAR | Status: DC | PRN

## 2017-08-09 MED ORDER — IBUPROFEN 600 MG PO TABS: 600.0000 mg | ORAL_TABLET | Freq: Four times a day (QID) | ORAL | Status: DC

## 2017-08-09 MED ORDER — MENTHOL 3 MG MT LOZG: 1.0000 | LOZENGE | OROMUCOSAL | Status: DC | PRN

## 2017-08-09 MED ORDER — FENTANYL CITRATE (PF) 100 MCG/2ML IJ SOLN
INTRAMUSCULAR | Status: AC
Start: 2017-08-09 — End: 2017-08-09
  Filled 2017-08-09: qty 2

## 2017-08-09 MED ORDER — CLINDAMYCIN PHOSPHATE 900 MG/50ML IV SOLN
900.0000 mg | Freq: Once | INTRAVENOUS | Status: AC
  Administered 2017-08-09: 900 mg via INTRAVENOUS
  Filled 2017-08-09: qty 50

## 2017-08-09 MED ORDER — DIPHENHYDRAMINE HCL 25 MG PO CAPS
25.0000 mg | ORAL_CAPSULE | ORAL | Status: DC | PRN
  Filled 2017-08-09: qty 1

## 2017-08-09 MED ORDER — NALBUPHINE HCL 10 MG/ML IJ SOLN: 5.0000 mg | INTRAMUSCULAR | Status: DC | PRN

## 2017-08-09 MED ORDER — OXYTOCIN 10 UNIT/ML IJ SOLN
INTRAVENOUS | Status: DC | PRN
  Administered 2017-08-09: 40 [IU] via INTRAVENOUS

## 2017-08-09 MED ORDER — DIPHENHYDRAMINE HCL 25 MG PO CAPS: 25.0000 mg | ORAL_CAPSULE | Freq: Four times a day (QID) | ORAL | Status: DC | PRN

## 2017-08-09 MED ORDER — SODIUM CHLORIDE 0.9% FLUSH: 3.0000 mL | INTRAVENOUS | Status: DC | PRN

## 2017-08-09 MED ORDER — WITCH HAZEL-GLYCERIN EX PADS: 1.0000 "application " | MEDICATED_PAD | CUTANEOUS | Status: DC | PRN

## 2017-08-09 MED ORDER — OXYTOCIN 40 UNITS IN LACTATED RINGERS INFUSION - SIMPLE MED: 2.5000 [IU]/h | INTRAVENOUS | Status: AC

## 2017-08-09 MED ORDER — NALOXONE HCL 0.4 MG/ML IJ SOLN: 0.4000 mg | INTRAMUSCULAR | Status: DC | PRN

## 2017-08-09 MED ORDER — METOCLOPRAMIDE HCL 5 MG/ML IJ SOLN
INTRAMUSCULAR | Status: DC | PRN
  Administered 2017-08-09 (×2): 5 mg via INTRAVENOUS

## 2017-08-09 MED ORDER — KETOROLAC TROMETHAMINE 30 MG/ML IJ SOLN
30.0000 mg | Freq: Four times a day (QID) | INTRAMUSCULAR | Status: AC | PRN
  Administered 2017-08-09: 30 mg via INTRAMUSCULAR

## 2017-08-09 MED ORDER — METOCLOPRAMIDE HCL 5 MG/ML IJ SOLN
INTRAMUSCULAR | Status: AC
  Filled 2017-08-09: qty 2

## 2017-08-09 MED ORDER — KETOROLAC TROMETHAMINE 30 MG/ML IJ SOLN
INTRAMUSCULAR | Status: AC
  Filled 2017-08-09: qty 1

## 2017-08-09 MED ORDER — SENNOSIDES-DOCUSATE SODIUM 8.6-50 MG PO TABS
2.0000 | ORAL_TABLET | ORAL | Status: DC
  Administered 2017-08-09 – 2017-08-11 (×3): 2 via ORAL
  Filled 2017-08-09 (×3): qty 2

## 2017-08-09 MED ORDER — GENTAMICIN SULFATE 40 MG/ML IJ SOLN
5.0000 mg/kg | Freq: Once | INTRAVENOUS | Status: DC
  Filled 2017-08-09: qty 8.5

## 2017-08-09 MED ORDER — MEPERIDINE HCL 25 MG/ML IJ SOLN: 6.2500 mg | INTRAMUSCULAR | Status: DC | PRN

## 2017-08-09 MED ORDER — LACTATED RINGERS IV SOLN
INTRAVENOUS | Status: DC | PRN
  Administered 2017-08-09: 08:00:00 via INTRAVENOUS

## 2017-08-09 MED ORDER — ENOXAPARIN SODIUM 40 MG/0.4ML ~~LOC~~ SOLN
40.0000 mg | SUBCUTANEOUS | Status: DC
  Filled 2017-08-09 (×3): qty 0.4

## 2017-08-09 MED ORDER — SIMETHICONE 80 MG PO CHEW
80.0000 mg | CHEWABLE_TABLET | Freq: Three times a day (TID) | ORAL | Status: DC
  Administered 2017-08-09 – 2017-08-12 (×8): 80 mg via ORAL
  Filled 2017-08-09 (×8): qty 1

## 2017-08-09 MED ORDER — ACETAMINOPHEN 325 MG PO TABS: 650.0000 mg | ORAL_TABLET | ORAL | Status: DC | PRN

## 2017-08-09 MED ORDER — KETOROLAC TROMETHAMINE 30 MG/ML IJ SOLN: 30.0000 mg | Freq: Four times a day (QID) | INTRAMUSCULAR | Status: AC | PRN

## 2017-08-09 MED ORDER — SODIUM CHLORIDE 0.9 % IR SOLN
Status: DC | PRN
  Administered 2017-08-09: 1000 mL

## 2017-08-09 MED ORDER — DIBUCAINE 1 % RE OINT: 1.0000 "application " | TOPICAL_OINTMENT | RECTAL | Status: DC | PRN

## 2017-08-09 MED ORDER — SCOPOLAMINE 1 MG/3DAYS TD PT72
MEDICATED_PATCH | TRANSDERMAL | Status: DC | PRN
  Administered 2017-08-09: 1 via TRANSDERMAL

## 2017-08-09 MED ORDER — GENTAMICIN SULFATE 40 MG/ML IJ SOLN
5.0000 mg/kg | INTRAMUSCULAR | Status: DC
Start: 2017-08-09 — End: 2017-08-09
  Filled 2017-08-09: qty 8.5

## 2017-08-09 MED ORDER — DEXAMETHASONE SODIUM PHOSPHATE 4 MG/ML IJ SOLN
INTRAMUSCULAR | Status: DC | PRN
  Administered 2017-08-09: 4 mg via INTRAVENOUS

## 2017-08-09 MED ORDER — MORPHINE SULFATE (PF) 0.5 MG/ML IJ SOLN
INTRAMUSCULAR | Status: DC | PRN
  Administered 2017-08-09: .2 mg via INTRATHECAL

## 2017-08-09 SURGICAL SUPPLY — 31 items
APL SKNCLS STERI-STRIP NONHPOA (GAUZE/BANDAGES/DRESSINGS) ×1
BENZOIN TINCTURE PRP APPL 2/3 (GAUZE/BANDAGES/DRESSINGS) ×2 IMPLANT
CLAMP CORD UMBIL (MISCELLANEOUS) ×2 IMPLANT
CLIP FILSHIE TUBAL LIGA STRL (Clip) ×4 IMPLANT
CLOSURE STERI STRIP 1/2 X4 (GAUZE/BANDAGES/DRESSINGS) ×2 IMPLANT
CLOTH BEACON ORANGE TIMEOUT ST (SAFETY) ×3 IMPLANT
DRSG OPSITE POSTOP 4X10 (GAUZE/BANDAGES/DRESSINGS) ×3 IMPLANT
DURAPREP 26ML APPLICATOR (WOUND CARE) ×3 IMPLANT
ELECT REM PT RETURN 9FT ADLT (ELECTROSURGICAL) ×3
ELECTRODE REM PT RTRN 9FT ADLT (ELECTROSURGICAL) ×1 IMPLANT
GLOVE BIOGEL PI IND STRL 6.5 (GLOVE) ×1 IMPLANT
GLOVE BIOGEL PI IND STRL 7.0 (GLOVE) ×1 IMPLANT
GLOVE BIOGEL PI INDICATOR 6.5 (GLOVE) ×2
GLOVE BIOGEL PI INDICATOR 7.0 (GLOVE) ×2
GLOVE ECLIPSE 6.5 STRL STRAW (GLOVE) ×3 IMPLANT
GOWN STRL REUS W/TWL LRG LVL3 (GOWN DISPOSABLE) ×6 IMPLANT
NS IRRIG 1000ML POUR BTL (IV SOLUTION) ×3 IMPLANT
PACK C SECTION WH (CUSTOM PROCEDURE TRAY) ×3 IMPLANT
PAD ABD 7.5X8 STRL (GAUZE/BANDAGES/DRESSINGS) IMPLANT
PAD OB MATERNITY 4.3X12.25 (PERSONAL CARE ITEMS) ×3 IMPLANT
PENCIL SMOKE EVAC W/HOLSTER (ELECTROSURGICAL) ×3 IMPLANT
RTRCTR C-SECT PINK 25CM LRG (MISCELLANEOUS) ×2 IMPLANT
SUT MON AB 2-0 CT1 27 (SUTURE) ×3 IMPLANT
SUT MON AB 4-0 PS1 27 (SUTURE) IMPLANT
SUT PDS AB 0 CTX 60 (SUTURE) ×2 IMPLANT
SUT PLAIN 2 0 XLH (SUTURE) ×2 IMPLANT
SUT VIC AB 0 CTX 36 (SUTURE) ×12
SUT VIC AB 0 CTX36XBRD ANBCTRL (SUTURE) ×4 IMPLANT
SUT VIC AB 4-0 KS 27 (SUTURE) ×2 IMPLANT
TOWEL OR 17X24 6PK STRL BLUE (TOWEL DISPOSABLE) ×3 IMPLANT
TRAY FOLEY BAG SILVER LF 14FR (SET/KITS/TRAYS/PACK) ×3 IMPLANT

## 2017-08-09 NOTE — Op Note (Signed)
NAME:  Lori Grimes, Lori Grimes                   ACCOUNT NO.:  MEDICAL RECORD NO.:  [id number]  LOCATION:                                 FACILITY:  PHYSICIAN:  Philip Aspen, DO    DATE OF BIRTH:  1991-02-22  DATE OF PROCEDURE:  08/09/2017 DATE OF DISCHARGE:                              OPERATIVE REPORT   PREOPERATIVE DIAGNOSIS:  Desired repeat cesarean section with permanent sterilization.  POSTOPERATIVE DIAGNOSIS:  Desired repeat cesarean section with permanent sterilization.  PROCEDURE:  Low-transverse cesarean incision with bilateral tubal ligation with Filshie clips.  SURGEON:  Philip Aspen, DO.  ASSISTANT:  Ilda Mori, M.D.  ANESTHESIA:  Spinal.  ESTIMATED BLOOD LOSS:  880 mL.  OPERATIVE ANTIBIOTICS:  Gentamicin and clindamycin.  SPECIMENS:  Cord blood.  COMPLICATIONS:  None.  FINDINGS:  Female infant in breech presentation with Apgar pending and weight pending.  Normal tubes and ovaries bilaterally.  Normal-appearing uterus, normal-appearing appendix.  Minimal scar tissue.  DESCRIPTION OF PROCEDURE:  The patient was taken to the operating room where spinal anesthesia was administered and found to be adequate.  She was prepped and draped in normal sterile fashion in the dorsal supine position with a leftward tilt.  A Pfannenstiel skin incision was made with a scalpel and carried down to the underlying layer of fascia with Bovie cautery.  The fascia was incised at the midline and extended laterally with Mayo scissors.  Kocher clamps were placed at the superior aspect of the fascial incision and rectus muscles were dissected off bluntly and sharply.  Hemostat was used to separate the rectus muscles with excellent visualization of the peritoneum.  This was entered bluntly.  The peritoneal incision was extended by lateral traction and the abdomen and pelvis were manually surveyed.  No abnormalities were noted.  An Alexis self retractor was placed.  The  vesicouterine peritoneum was identified, tented and entered sharply with Metzenbaum scissors and extended laterally.  The bladder flap was developed visually.  A scalpel was used to make a low-transverse cesarean incision.  The amniotic sac was entered sharply and the incision was extended by lateral traction.  The infant's right foot was located, followed by left foot.  Legs, pelvis and abdomen were delivered without difficulty.  The baby was rotated from left to right, sweeping the arms across and down the infants chest.  The baby was kept in flexed position facilitating easy delivery of the infant's head.  The delayed cord clamping was performed while observing the uterine incision, which was not briskly bleeding. The infant was bulb suctioned. After approximately 40 seconds, the cord was clamped and cut and infant was handed off to awaiting neonatology. Cord blood was collected and external massage of the uterus performed. Good tone observed.  The placenta was removed by gentle traction on umbilical cord and fundal massage.  The uterus was cleared of all clot and debris.  The uterine incision was reapproximated and closed with 0 Vicryl in a running locked fashion.  A second layer of imbrication was not needed. The patient's right tube was identified, followed to its fimbriated end and a Filshie clip was placed with good visualization encompassing the  entire tube.  The same was performed on the patient's left side. However, after examination of the first application of the Filshie clip,it was not clearly encompassing the entire tube.  A second Filshie clip was placed and and this did completely surround tube.  Both ovaries were normal.  The Alexis self retractor was removed.  The incision was re-examined and found to be hemostatic.  The peritoneum was reapproximated and closed with Monocryl in running fashion followed by reapproximation of rectus muscles.  The fascia was then  reapproximated and closed with 0 Vicryl in a running fashion.  The subcutaneous tissue was irrigated, dried and minimal use of Bovie cautery was used to control small bleeders.  The skin was then reapproximated and closed with 3-0 Vicryl on a Keith needle.  The patient tolerated the procedure well.  Sponge, lap, and needle counts were correct x2.  The patient was taken to recovery in stable condition.          ______________________________ SidneyPhilip Aspen Demri Poulton, DO     [place]/MEDQ  D:  08/09/2017  T:  08/09/2017  Job:  [id number]

## 2017-08-09 NOTE — Anesthesia Postprocedure Evaluation (Signed)
Anesthesia Post Note  Patient: Lori Grimes  Procedure(s) Performed: REPEAT CESAREAN SECTION WITH BILATERAL TUBAL LIGATION (N/A )     Patient location during evaluation: Mother Baby Anesthesia Type: Spinal Level of consciousness: awake Pain management: satisfactory to patient Vital Signs Assessment: post-procedure vital signs reviewed and stable Respiratory status: spontaneous breathing Cardiovascular status: stable Anesthetic complications: no    Last Vitals:  Vitals:   08/09/17 1430 08/09/17 1814  BP:  (!) 100/57  Pulse:  (!) 59  Resp: 20 18  Temp: 36.8 C 36.9 C  SpO2:  98%    Last Pain:  Vitals:   08/09/17 1814  TempSrc:   PainSc: 0-No pain   Pain Goal:                 KeyCorpBURGER,Harlean Regula

## 2017-08-09 NOTE — H&P (Signed)
27 y.o. [redacted]w[redacted]d  [id number] comes in c/o for schedule repeat cesarean section with bilateral tubal ligation.  Otherwise has good fetal movement and no bleeding.  Past Medical History:  Diagnosis Date  . Factor 5 Leiden mutation, heterozygous (HCC)   . GERD (gastroesophageal reflux disease)   . Headache(784.0)    migraines  . Hx of varicella   . Kidney stones   . Urinary tract infection     Past Surgical History:  Procedure Laterality Date  . CESAREAN SECTION N/A 03/22/2013   Procedure: Primary Cesarean Section with delivery of baby    @; Apgars ;  Surgeon: Philip Aspen, DO;  Location: [facility];  Service: Obstetrics;  Laterality: N/A;  . DILATION AND CURETTAGE OF UTERUS      OB History  Gravida Para Term Preterm AB Living  5 1 1   3 1   SAB TAB Ectopic Multiple Live Births  2 1     1     # Outcome Date GA Lbr Len/2nd Weight Sex Delivery Anes PTL Lv  5 Current           4 Term 03/22/13 [redacted]w[redacted]d 22:01 / 03:10 9 lb 14.6 oz (4.495 kg) F CS-LTranv EPI  LIV  3 SAB 2013          2 TAB 2012          1 SAB 2009            Social History   Socioeconomic History  . Marital status: Single    Spouse name: Not on file  . Number of children: Not on file  . Years of education: Not on file  . Highest education level: Not on file  Occupational History  . Not on file  Social Needs  . Financial resource strain: Not on file  . Food insecurity:    Worry: Not on file    Inability: Not on file  . Transportation needs:    Medical: Not on file    Non-medical: Not on file  Tobacco Use  . Smoking status: Former Smoker    Packs/day: 1.00    Types: Cigarettes    Last attempt to quit: 07/12/2012    Years since quitting: 5.0  . Smokeless tobacco: Never Used  Substance and Sexual Activity  . Alcohol use: No  . Drug use: No  . Sexual activity: Not on file  Lifestyle  . Physical activity:    Days per week: Not on file    Minutes per session: Not on file  . Stress: Not on file  Relationships  .  Social connections:    Talks on phone: Not on file    Gets together: Not on file    Attends religious service: Not on file    Active member of club or organization: Not on file    Attends meetings of clubs or organizations: Not on file    Relationship status: Not on file  . Intimate partner violence:    Fear of current or ex partner: Not on file    Emotionally abused: Not on file    Physically abused: Not on file    Forced sexual activity: Not on file  Other Topics Concern  . Not on file  Social History Narrative  . Not on file   Other; Penicillins; and Shellfish allergy    Prenatal Transfer Tool  Maternal Diabetes: No Genetic Screening: Normal Maternal Ultrasounds/Referrals: Abnormal:  Findings:   Fetal renal pyelectasis resolved on f/u ultrasound Fetal Ultrasounds  or other Referrals:  None Maternal Substance Abuse:  No Significant Maternal Medications:  None Significant Maternal Lab Results: Lab values include: Group B Strep negative  Other PNC: uncomplicated, pt with heterozygous FVL, no h.o DVT    Vitals:   08/09/17 0546 08/09/17 0552  BP:  99/67  Pulse:  83  Resp:  18  Temp:  98.2 F (36.8 C)  TempSrc:  Oral  Weight: 205 lb 14.4 oz (93.4 kg)   Height: 5\' 3"  (1.6 m)     Lungs/Cor:  NAD Abdomen:  soft, gravid Ex:  no cords, erythema   A/P   Admit for schedule repeat c/s with BTL  GBS Neg  Pt will received Lovenox 40mg   12 hours post op and continue for 6 weeks postpartum qd. PCN allergy- will administer gentamicin and clindamycin for surg. prophylaxis   Other routine care.  Philip AspenALLAHAN, Rudell Marlowe

## 2017-08-09 NOTE — Transfer of Care (Addendum)
Immediate Anesthesia Transfer of Care Note  Patient: Lori Grimes  Procedure(s) Performed: REPEAT CESAREAN SECTION WITH BILATERAL TUBAL LIGATION (N/A )  Patient Location: PACU  Anesthesia Type:Spinal  Level of Consciousness: awake, alert  and oriented  Airway & Oxygen Therapy: Patient Spontanous Breathing  Post-op Assessment: stable  Post vital signs: Reviewed and stable  Last Vitals:  Vitals Value Taken Time  BP    Temp    Pulse 77 08/09/2017  8:58 AM  Resp 23 08/09/2017  8:58 AM  SpO2 100 % 08/09/2017  8:58 AM  Vitals shown include unvalidated device data.  Last Pain:  Vitals:   08/09/17 0600  TempSrc:   PainSc: 0-No pain         Complications: No apparent anesthesia complications

## 2017-08-09 NOTE — Brief Op Note (Signed)
08/09/2017  8:43 AM  PATIENT:  Lori GottronNicole Grimes  27 y.o. female  PRE-OPERATIVE DIAGNOSIS:  REPEAT / DESIRES STERILIZATION EDD: 08/12/17 PCN ALLERGY  POST-OPERATIVE DIAGNOSIS:  REPEAT / DESIRES STERILIZATION  PROCEDURE:  Procedure(s): REPEAT CESAREAN SECTION WITH BILATERAL TUBAL LIGATION (N/A) with Filshie clips  SURGEON:  Surgeon(s) and Role:    Philip Aspen* Jakwan Sally, DO - Primary ASSIST: Ilda Morikaplan, Richard, MD  FINDINGS: female infant, breech, APGARS 8,9, wt pending, bilateral tubes and ovaries normal  ANESTHESIA:   spinal  EBL:  880 mL   SPECIMEN:  Source of Specimen:  cord blood  DISPOSITION OF SPECIMEN:  N/A  COUNTS:  YES  PLAN OF CARE: Admit to inpatient   PATIENT DISPOSITION:  PACU - hemodynamically stable.   Delay start of Pharmacological VTE agent (>24hrs) due to surgical blood loss or risk of bleeding: no

## 2017-08-09 NOTE — Anesthesia Procedure Notes (Signed)
Spinal  Patient location during procedure: OR Start time: 08/09/2017 7:40 AM Staffing Anesthesiologist: Mal AmabileFoster, Modesty Rudy, MD Performed: anesthesiologist  Preanesthetic Checklist Completed: patient identified, site marked, surgical consent, pre-op evaluation, timeout performed, IV checked, risks and benefits discussed and monitors and equipment checked Spinal Block Patient position: sitting Prep: site prepped and draped and DuraPrep Patient monitoring: heart rate, cardiac monitor, continuous pulse ox and blood pressure Approach: midline Location: L3-4 Injection technique: single-shot Needle Needle type: Pencan  Needle gauge: 24 G Needle length: 9 cm Needle insertion depth: 6 cm Assessment Sensory level: T4 Additional Notes Patient tolerated procedure well. Adequate sensory level.

## 2017-08-09 NOTE — Anesthesia Postprocedure Evaluation (Signed)
Anesthesia Post Note  Patient: Lori Grimes  Procedure(s) Performed: REPEAT CESAREAN SECTION WITH BILATERAL TUBAL LIGATION (N/A )     Patient location during evaluation: PACU Anesthesia Type: Spinal Level of consciousness: oriented and awake and alert Pain management: pain level controlled Vital Signs Assessment: post-procedure vital signs reviewed and stable Respiratory status: spontaneous breathing, respiratory function stable and nonlabored ventilation Cardiovascular status: blood pressure returned to baseline and stable Postop Assessment: no headache, no backache, no apparent nausea or vomiting, patient able to bend at knees and spinal receding Anesthetic complications: no    Last Vitals:  Vitals:   08/09/17 0945 08/09/17 1000  BP: (!) 88/54 103/65  Pulse: 62 68  Resp: 12 16  Temp:    SpO2: 97% 98%    Last Pain:  Vitals:   08/09/17 0900  TempSrc: Oral  PainSc: 0-No pain   Pain Goal:                 Shaneen Reeser A.

## 2017-08-09 NOTE — Addendum Note (Signed)
Addendum  created 08/09/17 1837 by Algis GreenhouseBurger, Kennadie Brenner A, CRNA   Sign clinical note

## 2017-08-10 ENCOUNTER — Encounter (HOSPITAL_COMMUNITY): Payer: Self-pay | Admitting: Obstetrics and Gynecology

## 2017-08-10 LAB — CBC
HCT: 29.9 % — ABNORMAL LOW (ref 36.0–46.0)
HEMOGLOBIN: 10.3 g/dL — AB (ref 12.0–15.0)
MCH: 30.3 pg (ref 26.0–34.0)
MCHC: 34.4 g/dL (ref 30.0–36.0)
MCV: 87.9 fL (ref 78.0–100.0)
Platelets: 210 10*3/uL (ref 150–400)
RBC: 3.4 MIL/uL — AB (ref 3.87–5.11)
RDW: 13.4 % (ref 11.5–15.5)
WBC: 15.3 10*3/uL — AB (ref 4.0–10.5)

## 2017-08-10 LAB — BIRTH TISSUE RECOVERY COLLECTION (PLACENTA DONATION)

## 2017-08-10 NOTE — Progress Notes (Signed)
Patient refused Lovenox 40 mg sub Q at 2100 last night.  Dr. Tenny Crawoss was notified. Dr. Tenny Crawoss spoke with the patient about the importance of getting this medication and possible risks of not getting this medication due to her history of Factor 5 mutation. I offered to give her Lovenox at this time and patient was undecided.

## 2017-08-10 NOTE — Lactation Note (Signed)
This note was copied from a baby's chart. Lactation Consultation Note  Patient Name: Lori Grimes WUJWJ'XToday's Date: 08/10/2017 Reason for consult: Follow-up assessment;Infant weight loss;Term  Visited with Mom to offer her a double pump to support her milk supply.  Mom prefers to exclusively formula feed.  Baby is down 10% from birth weight.  Mom will let her nurse know if she decides to pump.    Explained pump routine to Mom.  Explained about pump rental program out of gift shop.     Consult Status Consult Status: Complete Date: 08/11/17 Follow-up type: Call as needed    [name], [name] E 08/10/2017, 6:18 PM

## 2017-08-10 NOTE — Lactation Note (Signed)
This note was copied from a baby's chart. Lactation Consultation Note Baby 19 hrs old. Mom is breast/formula feeding. Mom has 818 yr old that she pumped and bottle fed for 1 month. Mom gave formula during that time as well.  Mom has large pendulous breast w/everted nipples. Hand expression w/colostrum noted. Rt. Nipple larger than Lt. Mom states baby latches better to Rt. Nipple. Encouraged to finger roll Lt. Nipples in finger tips to evert well to obtain a deeper latch. Newborn behavior, feeding habits, STS, I&O, cluster feeding, supply and demand discussed. Encouraged to BF before formula feed. Mom states baby is fussy has gas. Noted abd. Slightly distended.  Mom encouraged to feed baby 8-12 times/24 hours and with feeding cues. Encouraged to call for assistance if needed. [facility] brochure given w/resources, support groups and LC services.  Patient Name: Girl Maryln Gottronicole Carachure ZOXWR'UToday's Date: 08/10/2017 Reason for consult: Initial assessment   Maternal Data Has patient been taught Hand Expression?: Yes Does the patient have breastfeeding experience prior to this delivery?: Yes  Feeding    LATCH Score       Type of Nipple: Everted at rest and after stimulation  Comfort (Breast/Nipple): Soft / non-tender        Interventions Interventions: Breast feeding basics reviewed;Support pillows;Breast massage;Hand express;Breast compression  Lactation Tools Discussed/Used WIC Program: No   Consult Status Consult Status: Follow-up Date: 08/11/17 Follow-up type: In-patient    Kent Braunschweig, Diamond NickelLAURA G 08/10/2017, 3:12 AM

## 2017-08-10 NOTE — Lactation Note (Signed)
This note was copied from a baby's chart. Lactation Consultation Note Baby 22 hrs old. Baby had 9% weight loss. Baby has BF 8 times since birth, 4 voids, 4 stools. Baby was fussy during the night, passing gas.  In football position latched well. Hand expressed colostrum, thick drops, not enough to supplement. When LC first saw mom, she didn't tell LC that she was just BF while in the hospital so mom can get colostrum. Has no intention to BF at home. Will formula feed. Similac 19  Will be given after every BF. Discussed supplementing w/mom. Reported to RN.  Patient Name: Lori Grimes ZOXWR'UToday's Date: 08/10/2017 Reason for consult: Follow-up assessment;Infant weight loss   Maternal Data Has patient been taught Hand Expression?: Yes Does the patient have breastfeeding experience prior to this delivery?: Yes  Feeding Feeding Type: Breast Fed Length of feed: 10 min(still on breast)  LATCH Score Latch: Grasps breast easily, tongue down, lips flanged, rhythmical sucking.  Audible Swallowing: A few with stimulation  Type of Nipple: Flat  Comfort (Breast/Nipple): Soft / non-tender  Hold (Positioning): Assistance needed to correctly position infant at breast and maintain latch.  LATCH Score: 7  Interventions Interventions: Assisted with latch;Position options;Support pillows;Skin to skin;Expressed milk;Breast massage;Hand express;Breast compression;Adjust position  Lactation Tools Discussed/Used WIC Program: No   Consult Status Consult Status: Follow-up Date: 08/10/17 Follow-up type: In-patient    Charyl DancerCARVER, Lori Grimes 08/10/2017, 6:58 AM

## 2017-08-10 NOTE — Progress Notes (Signed)
Subjective: Postpartum Day 1: Cesarean Delivery Patient reports pain controlled, no nausea or vomiting. Tolerating po Refused Lovenox 40mg  SQ last night  Objective: Vital signs in last 24 hours: Temp:  [97.7 F (36.5 C)-98.5 F (36.9 C)] 97.8 F (36.6 C) (03/26 0930) Pulse Rate:  [57-74] 57 (03/26 0930) Resp:  [14-20] 18 (03/26 0930) BP: (99-108)/(49-62) 99/53 (03/26 0930) SpO2:  [98 %-100 %] 98 % (03/26 0930)  Physical Exam:  General: alert, cooperative and appears stated age Lochia: appropriate Uterine Fundus: firm Incision: healing well DVT Evaluation: No evidence of DVT seen on physical exam.  Recent Labs    08/09/17 1151 08/10/17 0520  HGB 11.7* 10.3*  HCT 33.5* 29.9*    Assessment/Plan: Status post Cesarean section. Doing well postoperatively.  Continue current care. Pt is Heterozygous for factor V Leiden. Pt refused her lovenox sq last evening. Discussed with the patient that the postpartum period is the most hypercoagulable time of pregnancy. It is recommended to prophylactically anticoagulate carriers of factor V leiden in the postpartum period to prevent thromboembolic phenomena and decrease the risk of a fatal pulmonary embolism. Pt considering whether or not she wants to accept Lovenox  Lori Grimes 08/10/2017, 10:10 AM

## 2017-08-11 MED ORDER — INFLUENZA VAC SPLIT QUAD 0.5 ML IM SUSY
0.5000 mL | PREFILLED_SYRINGE | INTRAMUSCULAR | Status: AC
  Administered 2017-08-11: 0.5 mL via INTRAMUSCULAR

## 2017-08-11 MED ORDER — ENOXAPARIN SODIUM 60 MG/0.6ML ~~LOC~~ SOLN
50.0000 mg | SUBCUTANEOUS | Status: DC
  Filled 2017-08-11 (×2): qty 0.6

## 2017-08-11 NOTE — Progress Notes (Signed)
Patient is eating, ambulating, voiding.  Pain control is good.  Appropriate lochia, no complaints.  Vitals:   08/10/17 0527 08/10/17 0930 08/10/17 1803 08/11/17 0500  BP: (!) 100/54 (!) 99/53 (!) 111/48 120/75  Pulse: 66 (!) 57 72 (!) 59  Resp: 16 18 18 18   Temp: 98.4 F (36.9 C) 97.8 F (36.6 C) 97.9 F (36.6 C) 98.3 F (36.8 C)  TempSrc: Oral Oral Oral Oral  SpO2: 99% 98%    Weight:      Height:        Fundus firm Ext: no calf tenderness  Lab Results  Component Value Date   WBC 15.3 (H) 08/10/2017   HGB 10.3 (L) 08/10/2017   HCT 29.9 (L) 08/10/2017   MCV 87.9 08/10/2017   PLT 210 08/10/2017    --/--/B POS (03/22 0930)  A/P Post op day #2 s/p repeat c/s BTL Discussed with patient prophylactic lovenox and potential serious complications of not taking given known FVL mutation and multiple risk factors in addition that put her at risk for DVT/PE and a variety of complications including but not limited to death.  Pt states she is aware of the risks and declines Lovenox during her postpartum period. Baby with feeding issues, pt wants to wait for discharge until tomorrow.  Routine care.   Philip AspenALLAHAN, Colston Pyle

## 2017-08-12 MED ORDER — IBUPROFEN 600 MG PO TABS: 600.0000 mg | ORAL_TABLET | Freq: Four times a day (QID) | ORAL | 0 refills | Status: AC

## 2017-08-12 MED ORDER — OXYCODONE-ACETAMINOPHEN 5-325 MG PO TABS: 1.0000 | ORAL_TABLET | ORAL | 0 refills | Status: AC | PRN

## 2017-08-12 NOTE — Progress Notes (Signed)
POD#3 Pt without complaints' VSSAF IMP/ Stable Plan/ Will discharge to home.

## 2017-08-12 NOTE — Discharge Summary (Signed)
Obstetric Discharge Summary Reason for Admission: cesarean section Prenatal Procedures: ultrasound Intrapartum Procedures: cesarean: low cervical, transverse and tubal ligation Postpartum Procedures: none Complications-Operative and Postpartum: none Hemoglobin  Date Value Ref Range Status  08/10/2017 10.3 (L) 12.0 - 15.0 g/dL Final   HCT  Date Value Ref Range Status  08/10/2017 29.9 (L) 36.0 - 46.0 % Final    Physical Exam:  General: alert Lochia: appropriate Uterine Fundus: firm Incision: healing well DVT Evaluation: No evidence of DVT seen on physical exam.  Discharge Diagnoses: Term Pregnancy-delivered  Discharge Information: Date: 08/12/2017 Activity: pelvic rest Diet: routine Medications: PNV, Ibuprofen and Percocet Condition: stable Instructions: refer to practice specific booklet Discharge to: home Follow-up Information    Levi AlandAnderson, Jakevion Arney E, MD. Schedule an appointment as soon as possible for a visit in 1 month(s).   Specialty:  Obstetrics and Gynecology Contact information: [address] [place] [phone]           Newborn Data: Live born female  Birth Weight: 8 lb 5 oz (3770 g) APGAR: 8, 9  Newborn Delivery   Birth date/time:  08/09/2017 08:04:00 Delivery type:  C-Section, Low Transverse C-section categorization:  Repeat     Home with mother.  Tawfiq Favila E 08/12/2017, 8:32 AM

## 2017-09-15 ENCOUNTER — Encounter (HOSPITAL_COMMUNITY): Payer: Self-pay | Admitting: Obstetrics and Gynecology

## 2019-11-29 ENCOUNTER — Emergency Department (HOSPITAL_COMMUNITY)
Admission: EM | Admit: 2019-11-29 | Discharge: 2019-11-29 | Disposition: A | Discharge status: Home / Self Care | Payer: Medicaid Other | Attending: Emergency Medicine | Admitting: Emergency Medicine

## 2019-11-29 ENCOUNTER — Encounter (HOSPITAL_COMMUNITY): Payer: Self-pay | Admitting: Emergency Medicine

## 2019-11-29 DIAGNOSIS — Z5321 Procedure and treatment not carried out due to patient leaving prior to being seen by health care provider: Secondary | ICD-10-CM | POA: Insufficient documentation

## 2019-11-29 DIAGNOSIS — R1031 Right lower quadrant pain: Secondary | ICD-10-CM | POA: Insufficient documentation

## 2019-11-29 LAB — BASIC METABOLIC PANEL
Anion gap: 10 (ref 5–15)
BUN: 14 mg/dL (ref 6–20)
CO2: 21 mmol/L — ABNORMAL LOW (ref 22–32)
Calcium: 9 mg/dL (ref 8.9–10.3)
Chloride: 103 mmol/L (ref 98–111)
Creatinine, Ser: 0.5 mg/dL (ref 0.44–1.00)
GFR calc Af Amer: >60 mL/min (ref >60)
GFR calc non Af Amer: >60 mL/min (ref >60)
Glucose, Bld: 112 mg/dL — ABNORMAL HIGH (ref 70–99)
Potassium: 4.1 mmol/L (ref 3.5–5.1)
Sodium: 134 mmol/L — ABNORMAL LOW (ref 135–145)

## 2019-11-29 LAB — URINALYSIS, ROUTINE W REFLEX MICROSCOPIC
Bilirubin Urine: NEGATIVE
Glucose, UA: NEGATIVE mg/dL
Hgb urine dipstick: NEGATIVE
Ketones, ur: NEGATIVE mg/dL
Leukocytes,Ua: NEGATIVE
Nitrite: NEGATIVE
Protein, ur: NEGATIVE mg/dL
Specific Gravity, Urine: 1.02 (ref 1.005–1.030)
pH: 9 — ABNORMAL HIGH (ref 5.0–8.0)

## 2019-11-29 LAB — I-STAT BETA HCG BLOOD, ED (MC, WL, AP ONLY): I-stat hCG, quantitative: <5 m[IU]/mL (ref <5)

## 2019-11-29 LAB — CBC
HCT: 41.5 % (ref 36.0–46.0)
Hemoglobin: 14 g/dL (ref 12.0–15.0)
MCH: 31.4 pg (ref 26.0–34.0)
MCHC: 33.7 g/dL (ref 30.0–36.0)
MCV: 93 fL (ref 80.0–100.0)
Platelets: 255 10*3/uL (ref 150–400)
RBC: 4.46 MIL/uL (ref 3.87–5.11)
RDW: 11.9 % (ref 11.5–15.5)
WBC: 8.4 10*3/uL (ref 4.0–10.5)
nRBC: 0 % (ref 0.0–0.2)

## 2019-11-29 NOTE — ED Notes (Signed)
Patient states she is not in pain anymore, thinks if she had a kidney stone she may have passed it so she is going to leave. A/ox4, ambulatory with nad.

## 2019-11-29 NOTE — ED Triage Notes (Signed)
Pt reports R flank pain that radiates to R abdomen, symptoms began this am. Denies blood in urine or dysuria. Hx of kidney stones as a teenager.
